# Patient Record
Sex: Male | Born: 1965 | Race: White | Hispanic: No | Marital: Married | Smoking: Current every day smoker
Health system: Southern US, Community
[De-identification: ages and names within clinical notes are randomized; demographics above are authoritative.]

## PROBLEM LIST (undated history)

## (undated) DIAGNOSIS — E785 Hyperlipidemia, unspecified: Secondary | ICD-10-CM

## (undated) DIAGNOSIS — I10 Essential (primary) hypertension: Secondary | ICD-10-CM

## (undated) DIAGNOSIS — G4733 Obstructive sleep apnea (adult) (pediatric): Secondary | ICD-10-CM

## (undated) DIAGNOSIS — H53009 Unspecified amblyopia, unspecified eye: Secondary | ICD-10-CM

## (undated) DIAGNOSIS — N529 Male erectile dysfunction, unspecified: Secondary | ICD-10-CM

## (undated) HISTORY — DX: Essential (primary) hypertension: I10

## (undated) HISTORY — DX: Hyperlipidemia, unspecified: E78.5

## (undated) HISTORY — DX: Male erectile dysfunction, unspecified: N52.9

## (undated) HISTORY — DX: Unspecified amblyopia, unspecified eye: H53.009

## (undated) HISTORY — DX: Obstructive sleep apnea (adult) (pediatric): G47.33

---

## 1998-03-24 ENCOUNTER — Emergency Department (HOSPITAL_COMMUNITY): Admission: AD | Admit: 1998-03-24 | Discharge: 1998-03-24 | Payer: Self-pay | Admitting: Emergency Medicine

## 1999-05-08 ENCOUNTER — Emergency Department (HOSPITAL_COMMUNITY): Admission: EM | Admit: 1999-05-08 | Discharge: 1999-05-08 | Payer: Self-pay | Admitting: Emergency Medicine

## 1999-08-15 ENCOUNTER — Emergency Department (HOSPITAL_COMMUNITY): Admission: EM | Admit: 1999-08-15 | Discharge: 1999-08-15 | Payer: Self-pay | Admitting: Emergency Medicine

## 1999-08-17 ENCOUNTER — Emergency Department (HOSPITAL_COMMUNITY): Admission: EM | Admit: 1999-08-17 | Discharge: 1999-08-17 | Payer: Self-pay | Admitting: Emergency Medicine

## 1999-11-12 ENCOUNTER — Encounter: Payer: Self-pay | Admitting: Internal Medicine

## 1999-11-12 ENCOUNTER — Ambulatory Visit (HOSPITAL_COMMUNITY): Admission: RE | Admit: 1999-11-12 | Discharge: 1999-11-12 | Payer: Self-pay | Admitting: Internal Medicine

## 1999-11-12 ENCOUNTER — Encounter: Admission: RE | Admit: 1999-11-12 | Discharge: 1999-11-12 | Payer: Self-pay | Admitting: Internal Medicine

## 1999-12-03 ENCOUNTER — Encounter: Admission: RE | Admit: 1999-12-03 | Discharge: 1999-12-03 | Payer: Self-pay | Admitting: Internal Medicine

## 2000-01-02 ENCOUNTER — Encounter: Admission: RE | Admit: 2000-01-02 | Discharge: 2000-01-02 | Payer: Self-pay | Admitting: Hematology and Oncology

## 2000-06-19 ENCOUNTER — Emergency Department (HOSPITAL_COMMUNITY): Admission: EM | Admit: 2000-06-19 | Discharge: 2000-06-19 | Payer: Self-pay | Admitting: Emergency Medicine

## 2000-11-11 ENCOUNTER — Encounter: Admission: RE | Admit: 2000-11-11 | Discharge: 2000-11-11 | Payer: Self-pay | Admitting: Internal Medicine

## 2000-12-18 ENCOUNTER — Encounter: Admission: RE | Admit: 2000-12-18 | Discharge: 2000-12-18 | Payer: Self-pay

## 2000-12-26 ENCOUNTER — Encounter: Admission: RE | Admit: 2000-12-26 | Discharge: 2000-12-26 | Payer: Self-pay

## 2001-01-09 ENCOUNTER — Encounter: Admission: RE | Admit: 2001-01-09 | Discharge: 2001-01-09 | Payer: Self-pay | Admitting: Internal Medicine

## 2001-06-10 ENCOUNTER — Encounter: Admission: RE | Admit: 2001-06-10 | Discharge: 2001-06-10 | Payer: Self-pay | Admitting: Internal Medicine

## 2001-11-22 ENCOUNTER — Emergency Department (HOSPITAL_COMMUNITY): Admission: EM | Admit: 2001-11-22 | Discharge: 2001-11-22 | Payer: Self-pay | Admitting: Emergency Medicine

## 2002-03-23 ENCOUNTER — Emergency Department (HOSPITAL_COMMUNITY): Admission: EM | Admit: 2002-03-23 | Discharge: 2002-03-23 | Payer: Self-pay

## 2002-06-24 ENCOUNTER — Encounter: Admission: RE | Admit: 2002-06-24 | Discharge: 2002-06-24 | Payer: Self-pay | Admitting: Internal Medicine

## 2002-12-23 ENCOUNTER — Encounter: Admission: RE | Admit: 2002-12-23 | Discharge: 2002-12-23 | Payer: Self-pay | Admitting: Internal Medicine

## 2002-12-29 ENCOUNTER — Encounter: Admission: RE | Admit: 2002-12-29 | Discharge: 2002-12-29 | Payer: Self-pay | Admitting: Family Medicine

## 2002-12-29 ENCOUNTER — Encounter: Payer: Self-pay | Admitting: Family Medicine

## 2003-01-19 ENCOUNTER — Ambulatory Visit (HOSPITAL_BASED_OUTPATIENT_CLINIC_OR_DEPARTMENT_OTHER): Admission: RE | Admit: 2003-01-19 | Discharge: 2003-01-19 | Payer: Self-pay | Admitting: Internal Medicine

## 2003-03-09 ENCOUNTER — Emergency Department (HOSPITAL_COMMUNITY): Admission: EM | Admit: 2003-03-09 | Discharge: 2003-03-09 | Payer: Self-pay | Admitting: Emergency Medicine

## 2003-03-29 ENCOUNTER — Emergency Department (HOSPITAL_COMMUNITY): Admission: EM | Admit: 2003-03-29 | Discharge: 2003-03-30 | Payer: Self-pay | Admitting: *Deleted

## 2003-05-25 ENCOUNTER — Encounter: Admission: RE | Admit: 2003-05-25 | Discharge: 2003-05-25 | Payer: Self-pay | Admitting: Internal Medicine

## 2003-07-28 ENCOUNTER — Encounter: Admission: RE | Admit: 2003-07-28 | Discharge: 2003-07-28 | Payer: Self-pay | Admitting: Internal Medicine

## 2003-11-25 ENCOUNTER — Emergency Department (HOSPITAL_COMMUNITY): Admission: AD | Admit: 2003-11-25 | Discharge: 2003-11-25 | Payer: Self-pay | Admitting: Family Medicine

## 2003-12-30 ENCOUNTER — Emergency Department (HOSPITAL_COMMUNITY): Admission: EM | Admit: 2003-12-30 | Discharge: 2003-12-30 | Payer: Self-pay | Admitting: Emergency Medicine

## 2004-06-28 ENCOUNTER — Ambulatory Visit: Payer: Self-pay | Admitting: Internal Medicine

## 2004-10-31 ENCOUNTER — Ambulatory Visit: Payer: Self-pay | Admitting: Internal Medicine

## 2005-01-17 ENCOUNTER — Emergency Department (HOSPITAL_COMMUNITY): Admission: EM | Admit: 2005-01-17 | Discharge: 2005-01-17 | Payer: Self-pay | Admitting: Emergency Medicine

## 2005-03-23 ENCOUNTER — Emergency Department (HOSPITAL_COMMUNITY): Admission: EM | Admit: 2005-03-23 | Discharge: 2005-03-23 | Payer: Self-pay | Admitting: Emergency Medicine

## 2005-06-19 ENCOUNTER — Ambulatory Visit: Payer: Self-pay | Admitting: Internal Medicine

## 2005-06-28 ENCOUNTER — Ambulatory Visit: Payer: Self-pay | Admitting: Internal Medicine

## 2005-11-04 ENCOUNTER — Encounter: Admission: RE | Admit: 2005-11-04 | Discharge: 2005-11-04 | Payer: Self-pay | Admitting: Occupational Medicine

## 2006-02-21 ENCOUNTER — Ambulatory Visit: Payer: Self-pay | Admitting: Internal Medicine

## 2006-08-30 ENCOUNTER — Emergency Department: Payer: Self-pay | Admitting: Unknown Physician Specialty

## 2006-10-02 ENCOUNTER — Emergency Department (HOSPITAL_COMMUNITY): Admission: EM | Admit: 2006-10-02 | Discharge: 2006-10-02 | Payer: Self-pay | Admitting: Emergency Medicine

## 2006-10-09 DIAGNOSIS — I10 Essential (primary) hypertension: Secondary | ICD-10-CM | POA: Insufficient documentation

## 2006-10-09 DIAGNOSIS — G4730 Sleep apnea, unspecified: Secondary | ICD-10-CM | POA: Insufficient documentation

## 2006-10-09 DIAGNOSIS — F17200 Nicotine dependence, unspecified, uncomplicated: Secondary | ICD-10-CM | POA: Insufficient documentation

## 2006-10-10 ENCOUNTER — Encounter (INDEPENDENT_AMBULATORY_CARE_PROVIDER_SITE_OTHER): Payer: Self-pay | Admitting: Internal Medicine

## 2006-10-10 ENCOUNTER — Ambulatory Visit: Payer: Self-pay | Admitting: Internal Medicine

## 2006-10-10 DIAGNOSIS — M51379 Other intervertebral disc degeneration, lumbosacral region without mention of lumbar back pain or lower extremity pain: Secondary | ICD-10-CM | POA: Insufficient documentation

## 2006-10-10 DIAGNOSIS — M5137 Other intervertebral disc degeneration, lumbosacral region: Secondary | ICD-10-CM

## 2006-10-10 DIAGNOSIS — E785 Hyperlipidemia, unspecified: Secondary | ICD-10-CM | POA: Insufficient documentation

## 2006-10-10 LAB — CONVERTED CEMR LAB
ALT: 17 units/L (ref 0–53)
AST: 14 units/L (ref 0–37)
CO2: 27 meq/L (ref 19–32)
Cholesterol: 180 mg/dL (ref 0–200)
LDL Cholesterol: 125 mg/dL — ABNORMAL HIGH (ref 0–99)
Sodium: 141 meq/L (ref 135–145)
Total Bilirubin: 0.4 mg/dL (ref 0.3–1.2)
Total CHOL/HDL Ratio: 5.1
Total Protein: 7.3 g/dL (ref 6.0–8.3)
VLDL: 20 mg/dL (ref 0–40)

## 2006-10-28 ENCOUNTER — Encounter: Admission: RE | Admit: 2006-10-28 | Discharge: 2006-10-28 | Payer: Self-pay | Admitting: Occupational Medicine

## 2007-01-22 ENCOUNTER — Encounter (INDEPENDENT_AMBULATORY_CARE_PROVIDER_SITE_OTHER): Payer: Self-pay | Admitting: Internal Medicine

## 2007-01-23 ENCOUNTER — Telehealth (INDEPENDENT_AMBULATORY_CARE_PROVIDER_SITE_OTHER): Payer: Self-pay | Admitting: *Deleted

## 2007-01-26 ENCOUNTER — Ambulatory Visit: Payer: Self-pay | Admitting: Hospitalist

## 2007-01-26 LAB — CONVERTED CEMR LAB
Cholesterol, target level: 200 mg/dL
HDL goal, serum: 40 mg/dL
LDL Goal: 130 mg/dL

## 2007-02-04 ENCOUNTER — Ambulatory Visit: Payer: Self-pay | Admitting: Internal Medicine

## 2007-02-04 ENCOUNTER — Encounter (INDEPENDENT_AMBULATORY_CARE_PROVIDER_SITE_OTHER): Payer: Self-pay | Admitting: Internal Medicine

## 2007-02-05 LAB — CONVERTED CEMR LAB
ALT: 22 units/L (ref 0–53)
AST: 19 units/L (ref 0–37)
Alkaline Phosphatase: 87 units/L (ref 39–117)
BUN: 13 mg/dL (ref 6–23)
Chloride: 105 meq/L (ref 96–112)
Creatinine, Ser: 0.91 mg/dL (ref 0.40–1.50)
HDL: 39 mg/dL — ABNORMAL LOW (ref >39)
LDL Cholesterol: 120 mg/dL — ABNORMAL HIGH (ref 0–99)
Total Bilirubin: 0.5 mg/dL (ref 0.3–1.2)
Total CHOL/HDL Ratio: 4.7
VLDL: 25 mg/dL (ref 0–40)

## 2007-02-25 ENCOUNTER — Ambulatory Visit: Payer: Self-pay | Admitting: Internal Medicine

## 2007-02-25 ENCOUNTER — Encounter (INDEPENDENT_AMBULATORY_CARE_PROVIDER_SITE_OTHER): Payer: Self-pay | Admitting: Dermatology

## 2007-09-20 ENCOUNTER — Emergency Department (HOSPITAL_COMMUNITY): Admission: EM | Admit: 2007-09-20 | Discharge: 2007-09-20 | Payer: Self-pay | Admitting: Family Medicine

## 2007-09-23 ENCOUNTER — Telehealth: Payer: Self-pay | Admitting: Infectious Disease

## 2007-09-25 ENCOUNTER — Ambulatory Visit: Payer: Self-pay | Admitting: Infectious Disease

## 2007-09-25 ENCOUNTER — Encounter (INDEPENDENT_AMBULATORY_CARE_PROVIDER_SITE_OTHER): Payer: Self-pay | Admitting: *Deleted

## 2007-09-25 DIAGNOSIS — F528 Other sexual dysfunction not due to a substance or known physiological condition: Secondary | ICD-10-CM

## 2007-09-25 LAB — CONVERTED CEMR LAB
AST: 12 units/L (ref 0–37)
Albumin: 4.4 g/dL (ref 3.5–5.2)
Alkaline Phosphatase: 85 units/L (ref 39–117)
Glucose, Bld: 96 mg/dL (ref 70–99)
LDL Cholesterol: 122 mg/dL — ABNORMAL HIGH (ref 0–99)
Potassium: 4.3 meq/L (ref 3.5–5.3)
Sodium: 139 meq/L (ref 135–145)
Total Bilirubin: 0.5 mg/dL (ref 0.3–1.2)
Total Protein: 7.2 g/dL (ref 6.0–8.3)
Triglycerides: 106 mg/dL (ref <150)
VLDL: 21 mg/dL (ref 0–40)

## 2007-09-28 ENCOUNTER — Telehealth: Payer: Self-pay | Admitting: Licensed Clinical Social Worker

## 2007-10-07 ENCOUNTER — Telehealth: Payer: Self-pay | Admitting: *Deleted

## 2008-10-31 ENCOUNTER — Telehealth: Payer: Self-pay | Admitting: *Deleted

## 2008-12-13 ENCOUNTER — Ambulatory Visit: Payer: Self-pay | Admitting: Internal Medicine

## 2008-12-13 ENCOUNTER — Encounter (INDEPENDENT_AMBULATORY_CARE_PROVIDER_SITE_OTHER): Payer: Self-pay | Admitting: Internal Medicine

## 2008-12-13 LAB — CONVERTED CEMR LAB
CO2: 24 meq/L (ref 19–32)
Calcium: 9 mg/dL (ref 8.4–10.5)
Chloride: 103 meq/L (ref 96–112)
Creatinine, Ser: 0.89 mg/dL (ref 0.40–1.50)
GFR calc non Af Amer: >60 mL/min (ref >60)
Sodium: 138 meq/L (ref 135–145)

## 2009-01-11 ENCOUNTER — Encounter: Admission: RE | Admit: 2009-01-11 | Discharge: 2009-01-11 | Payer: Self-pay | Admitting: Occupational Medicine

## 2009-07-14 ENCOUNTER — Telehealth (INDEPENDENT_AMBULATORY_CARE_PROVIDER_SITE_OTHER): Payer: Self-pay | Admitting: Internal Medicine

## 2009-11-17 ENCOUNTER — Telehealth (INDEPENDENT_AMBULATORY_CARE_PROVIDER_SITE_OTHER): Payer: Self-pay | Admitting: Internal Medicine

## 2009-12-01 ENCOUNTER — Encounter: Admission: RE | Admit: 2009-12-01 | Discharge: 2009-12-01 | Payer: Self-pay | Admitting: Internal Medicine

## 2009-12-01 ENCOUNTER — Encounter (INDEPENDENT_AMBULATORY_CARE_PROVIDER_SITE_OTHER): Payer: Self-pay | Admitting: Internal Medicine

## 2010-01-01 ENCOUNTER — Ambulatory Visit: Payer: Self-pay | Admitting: Internal Medicine

## 2010-01-26 ENCOUNTER — Telehealth (INDEPENDENT_AMBULATORY_CARE_PROVIDER_SITE_OTHER): Payer: Self-pay | Admitting: *Deleted

## 2010-03-21 ENCOUNTER — Telehealth: Payer: Self-pay | Admitting: Internal Medicine

## 2010-05-25 ENCOUNTER — Emergency Department (HOSPITAL_COMMUNITY): Admission: EM | Admit: 2010-05-25 | Discharge: 2010-05-25 | Payer: Self-pay | Admitting: Family Medicine

## 2010-10-11 NOTE — Letter (Signed)
Summary: RADIOLOGY/EKG/ADIOGRAM/[facility]  RADIOLOGY/EKG/ADIOGRAM/[facility]   Imported By: Margie Billet 01/16/2010 14:39:52  _____________________________________________________________________  External Attachment:    Type:   Image     Comment:   External Document  Appended Document: RADIOLOGY/EKG/ADIOGRAM/[facility]   Impression & Recommendations:  Problem # 1:  DYSLIPIDEMIA (ICD-272.4) See outside record 12/01/2009. LDL at goal, 117.  His updated medication list for this problem includes:    Lovastatin 40 Mg Tabs (Lovastatin) .Marland Kitchen... Take 1 tablet by mouth once a day    Niacin Cr 1000 Mg Tbcr (Niacin) .Marland Kitchen... Take 1 tablet by mouth once a day for cholesterol.   Complete Medication List: 1)  Levitra 10 Mg Tabs (Vardenafil hcl) .... Take one tab by mouth as needed prior to intercourse 2)  Lovastatin 40 Mg Tabs (Lovastatin) .... Take 1 tablet by mouth once a day 3)  Lotensin 10 Mg Tabs (Benazepril hcl) .... Take 1 tablet by mouth once a day 4)  Niacin Cr 1000 Mg Tbcr (Niacin) .... Take 1 tablet by mouth once a day for cholesterol. 5)  Baby Aspirin 81 Mg Chew (Aspirin) .... Take 1 tablet by mouth once a day

## 2010-10-11 NOTE — Progress Notes (Signed)
Summary: med refill/gp  Phone Note Refill Request Message from:  Fax from Pharmacy on Jan 26, 2010 12:23 PM  Refills Requested: Medication #1:  LOTENSIN 10 MG TABS Take 1 tablet by mouth once a day   Last Refilled: 12/22/2009 Last appt. 01/01/10.  Last BMP 12/13/09.   Method Requested: Electronic Initial call taken by: Chinita Pester RN,  Jan 26, 2010 12:23 PM  Follow-up for Phone Call        Rx written Follow-up by: Paulette Blanch Dam MD,  Jan 26, 2010 2:00 PM    Prescriptions: LOTENSIN 10 MG TABS (BENAZEPRIL HCL) Take 1 tablet by mouth once a day  #31 x 11   Entered and Authorized by:   Acey Lav MD   Signed by:   Paulette Blanch Dam MD on 01/26/2010   Method used:   Electronically to        [facility] [phone]* (retail)       [address]       [place], [place]  [address]       Ph: [phone]       Fax: [phone]   RxID:   [id number]

## 2010-10-11 NOTE — Progress Notes (Signed)
Summary: med refill/gp  Phone Note Refill Request Message from:  Fax from Pharmacy on March 21, 2010 12:19 PM  Refills Requested: Medication #1:  LOVASTATIN 40 MG TABS Take 1 tablet by mouth once a day   Dosage confirmed as above?Dosage Confirmed   Last Refilled: 02/05/2010  Method Requested: Electronic Initial call taken by: Chinita Pester RN,  March 21, 2010 12:19 PM    Prescriptions: LOVASTATIN 40 MG TABS (LOVASTATIN) Take 1 tablet by mouth once a day  #31 x 11   Entered and Authorized by:   Jackson Latino MD   Signed by:   Jackson Latino MD on 03/21/2010   Method used:   Electronically to        [facility] [phone]* (retail)       [address]       [place], [place]  [address]       Ph: [phone]       Fax: [phone]   RxID:   [id number]

## 2010-10-11 NOTE — Progress Notes (Signed)
Summary: Refill/gh  Phone Note Refill Request Message from:  Fax from Pharmacy on November 17, 2009 3:47 PM  Refills Requested: Medication #1:  LOTENSIN 10 MG TABS Take 1 tablet by mouth once a day   Last Refilled: 10/15/2009  Method Requested: Electronic Initial call taken by: Angelina Ok RN,  November 17, 2009 3:48 PM    Prescriptions: LOTENSIN 10 MG TABS (BENAZEPRIL HCL) Take 1 tablet by mouth once a day  #31 x 1   Entered and Authorized by:   Zara Council MD   Signed by:   Zara Council MD on 11/17/2009   Method used:   Electronically to        [facility] [phone]* (retail)       [address]       [place], [place]  [address]       Ph: [phone]       Fax: [phone]   RxID:   [id number]

## 2010-10-11 NOTE — Assessment & Plan Note (Signed)
Summary: ROUTINE CK/EST/VS   Vital Signs:  Patient profile:   45 year old male Height:      70 inches (177.80 cm) Weight:      197.01 pounds (89.55 kg) BMI:     28.37 Temp:     98.6 degrees F (37.00 degrees C) Pulse rate:   90 / minute BP sitting:   136 / 82  (left arm)  Vitals Entered By: Angelina Ok RN (January 01, 2010 4:15 PM) Is Patient Diabetic? No Pain Assessment Patient in pain? no      Nutritional Status BMI of 25 - 29 = overweight  Have you ever been in a relationship where you felt threatened, hurt or afraid?No   Does patient need assistance? Functional Status Self care Ambulation Normal Comments Needs refills.  Check up.  Had a physical less than 1 month ago.     Primary Care Provider:  None   History of Present Illness: Isaiah Gomez is a 45 year old Male with PMH/problems as outlined in the EMR, who presents to the [facility] for a routine follow up. He has no complaints today. Doing all his meds as prescribed. He recently had a complete physical and blood work as part of employment physical at [employer].    Preventive Screening-Counseling & Management  Alcohol-Tobacco     Smoking Status: current     Smoking Cessation Counseling: yes     Packs/Day: 1     Year Started: 25 years ago  Current Medications (verified): 1)  Levitra 10 Mg Tabs (Vardenafil Hcl) .... Take One Tab By Mouth As Needed Prior To Intercourse 2)  Lovastatin 40 Mg Tabs (Lovastatin) .... Take 1 Tablet By Mouth Once A Day 3)  Lotensin 10 Mg Tabs (Benazepril Hcl) .... Take 1 Tablet By Mouth Once A Day 4)  Niacin Cr 1000 Mg  Tbcr (Niacin) .... Take 1 Tablet By Mouth Once A Day For Cholesterol. 5)  Baby Aspirin 81 Mg  Chew (Aspirin) .... Take 1 Tablet By Mouth Once A Day  Allergies (verified): 1)  ! Prednisone 2)  ! 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  Married.  Smokes 1ppd x 20 years.  Occasional beer.  No drugs.  Risk Factors: Caffeine Use: 5+ (12/13/2008) Exercise: no (12/13/2008)  Risk Factors: Smoking Status: current (01/01/2010) Packs/Day: 1 (01/01/2010)  Review of Systems       Review of System: Negative except per HPI.    Physical Exam  General:  alert and well-developed.   Lungs:  normal respiratory effort and normal breath sounds.   Heart:  normal rate and regular rhythm.   Abdomen:  soft and non-tender.   Pulses:  normal peripheral pulses  Extremities:  no cyanosis, clubbing or edema  Neurologic:  alert & oriented X3.  non focal   Impression & Recommendations:  Problem # 1:  DYSLIPIDEMIA (ICD-272.4) Continue the current regimen. I wil review his lab work and see if any changes needed.  His updated medication list for this problem includes:    Lovastatin 40 Mg Tabs (Lovastatin) .Marland Kitchen... Take 1 tablet by mouth once a day  Niacin Cr 1000 Mg Tbcr (Niacin) .Marland Kitchen... Take 1 tablet by mouth once a day for cholesterol.  Problem # 2:  HYPERTENSION (ICD-401.9) At goal. Continue the current regimen.  Will review b-met.  His updated medication list for this problem includes:    Lotensin 10 Mg Tabs (Benazepril hcl) .Marland Kitchen... Take 1 tablet by mouth once a day  Complete Medication List: 1)  Levitra 10 Mg Tabs (Vardenafil hcl) .... Take one tab by mouth as needed prior to intercourse 2)  Lovastatin 40 Mg Tabs (Lovastatin) .... Take 1 tablet by mouth once a day 3)  Lotensin 10 Mg Tabs (Benazepril hcl) .... Take 1 tablet by mouth once a day 4)  Niacin Cr 1000 Mg Tbcr (Niacin) .... Take 1 tablet by mouth once a day for  cholesterol. 5)  Baby Aspirin 81 Mg Chew (Aspirin) .... Take 1 tablet by mouth once a day  Patient Instructions: 1)  Please schedule a follow-up appointment in 3-6  months. 2)  Please bring in your lab results so that we can review.   Prevention & Chronic Care Immunizations   Influenza vaccine: Not documented   Influenza vaccine deferral: Deferred  (01/01/2010)    Tetanus booster: Not documented    Pneumococcal vaccine: Not documented  Other Screening   Smoking status: current  (01/01/2010)   Smoking cessation counseling: yes  (01/01/2010)  Lipids   Total Cholesterol: 191  (09/25/2007)   LDL: 122  (09/25/2007)   LDL Direct: Not documented   HDL: 48  (09/25/2007)   Triglycerides: 106  (09/25/2007)    SGOT (AST): 12  (09/25/2007)   SGPT (ALT): 19  (09/25/2007)   Alkaline phosphatase: 85  (09/25/2007)   Total bilirubin: 0.5  (09/25/2007)    Lipid flowsheet reviewed?: Yes   Progress toward LDL goal: Unchanged  Hypertension   Last Blood Pressure: 136 / 82  (01/01/2010)   Serum creatinine: 0.89  (12/13/2008)   Serum potassium 4.0  (12/13/2008)    Hypertension flowsheet reviewed?: Yes   Progress toward BP goal: At goal  Self-Management Support :    Patient will work on the following items until the next clinic visit to reach self-care goals:     Medications and monitoring: take my medicines every day, bring all of my medications to every visit  (01/01/2010)     Eating: drink diet soda or water instead of juice or soda, use fresh or frozen vegetables, eat foods that are low in salt, eat baked foods instead of fried foods, limit or avoid alcohol  (01/01/2010)     Activity: take a 30 minute walk every day, take the stairs instead of the elevator  (01/01/2010)    Hypertension self-management support: Written self-care plan, Education handout, Pre-printed educational material, Resources for patients handout  (01/01/2010)   Hypertension self-care plan printed.   Hypertension  education handout printed    Lipid self-management support: Written self-care plan, Education handout, Pre-printed educational material, Resources for patients handout  (01/01/2010)   Lipid self-care plan printed.   Lipid education handout printed      Resource handout printed.     Vital Signs:  Patient profile:   45 year old male Height:      70 inches (177.80 cm) Weight:      197.01 pounds (89.55 kg) BMI:     28.37 Temp:     98.6 degrees F (37.00 degrees C) Pulse rate:   90 / minute BP sitting:   136 / 82  (left arm)  Vitals Entered By: Angelina Ok RN (January 01, 2010 4:15 PM)

## 2011-01-22 ENCOUNTER — Encounter: Payer: Self-pay | Admitting: Internal Medicine

## 2011-02-06 ENCOUNTER — Encounter: Payer: Self-pay | Admitting: Internal Medicine

## 2011-02-06 ENCOUNTER — Ambulatory Visit (INDEPENDENT_AMBULATORY_CARE_PROVIDER_SITE_OTHER): Payer: BC Managed Care – PPO | Admitting: Internal Medicine

## 2011-02-06 DIAGNOSIS — Z23 Encounter for immunization: Secondary | ICD-10-CM

## 2011-02-06 DIAGNOSIS — E785 Hyperlipidemia, unspecified: Secondary | ICD-10-CM

## 2011-02-06 DIAGNOSIS — F17200 Nicotine dependence, unspecified, uncomplicated: Secondary | ICD-10-CM

## 2011-02-06 DIAGNOSIS — I10 Essential (primary) hypertension: Secondary | ICD-10-CM

## 2011-02-06 DIAGNOSIS — M7710 Lateral epicondylitis, unspecified elbow: Secondary | ICD-10-CM | POA: Insufficient documentation

## 2011-02-06 DIAGNOSIS — R6882 Decreased libido: Secondary | ICD-10-CM

## 2011-02-06 LAB — BASIC METABOLIC PANEL
CO2: 27 mEq/L (ref 19–32)
Calcium: 9.5 mg/dL (ref 8.4–10.5)
Creat: 0.89 mg/dL (ref 0.40–1.50)
Glucose, Bld: 78 mg/dL (ref 70–99)

## 2011-02-06 LAB — TSH: TSH: 4.784 u[IU]/mL — ABNORMAL HIGH (ref 0.350–4.500)

## 2011-02-06 LAB — CBC
MCHC: 34.1 g/dL (ref 30.0–36.0)
Platelets: 165 10*3/uL (ref 150–400)
RDW: 12.1 % (ref 11.5–15.5)
WBC: 8 10*3/uL (ref 4.0–10.5)

## 2011-02-06 MED ORDER — PRAVASTATIN SODIUM 40 MG PO TABS: 40.0000 mg | ORAL_TABLET | Freq: Every evening | ORAL | Status: DC

## 2011-02-06 MED ORDER — DICLOFENAC SODIUM 75 MG PO TBEC: 75.0000 mg | DELAYED_RELEASE_TABLET | Freq: Two times a day (BID) | ORAL | Status: DC

## 2011-02-06 NOTE — Assessment & Plan Note (Signed)
Patient was counseled on smoking cessation strategies including medications and behavior modification options. Patient said she was not ready to stop smoking at this time.    

## 2011-02-06 NOTE — Patient Instructions (Signed)
Tennis Elbow Your caregiver has diagnosed you with a condition often referred to as "Tennis Elbow." This results from small tears or soreness (inflammation) at the start (origin) of the extensor muscles of the forearm. Although the condition is often called tennis or golfer's elbow, it is caused by any repetitive action performed by your elbow. HOME CARE INSTRUCTIONS  If the condition has been short lived, rest may be the only treatment required. Using your opposite hand or arm to perform the task may help. Even changing your grip may help rest the extremity. These may even prevent the condition from recurring.   Longer standing problems, however, will often be relieved faster by:   Using anti-inflammatory agents.   Applying ice packs for 30 minutes at the end of the working day, at bed time, or when activities are finished.   Your caregiver may also have you wear a splint or sling. This will allow the inflamed tendon to heal.  At times, steroid injections aided with a local anesthetic will be required along with splinting for 1 to 2 weeks. Two to three steroid injections will often solve the problem. In some long standing cases, the inflamed tendon does not respond to conservative (non-surgical) therapy. Then surgery may be required to repair it. MAKE SURE YOU:   Understand these instructions.   Will watch your condition.   Will get help right away if you are not doing well or get worse.  Document Released: 08/26/2005 Document Re-Released: 11/22/2008 Exit[employer] Patient Information 2011 [employer]. 

## 2011-02-06 NOTE — Progress Notes (Signed)
  Subjective:    Patient ID: Isaiah Gomez, male    DOB: [birth year], 45 y.o.   MRN: [id number]  HPI  Patient is a 45 year old male with a past medical history of hypertension, hyperlipidemia presents to the outpatient clinic for chief complaint of right lateral elbow pain, the patient describes the pain as dull in nature and has been constant for the past month, the pain is aggravated by movement especially pronation and relieved by rest. The patient reports a tender point in his lateral epicondyle region. No swelling has been noted. On review of patient's chart his cholesterol is above goal despite being on lovastatin. Patient has been compliant with all his medications denies any other complaints.  Review of Systems  [all other systems reviewed and are negative       Objective:   Physical Exam  Constitutional: He is oriented to person, place, and time. He appears well-developed and well-nourished.  HENT:  Head: Normocephalic and atraumatic.  Eyes: Pupils are equal, round, and reactive to light.  Neck: Normal range of motion. No JVD present. No thyromegaly present.  Cardiovascular: Normal rate, regular rhythm and normal heart sounds.   Pulmonary/Chest: Effort normal and breath sounds normal. He has no wheezes. He has no rales.  Abdominal: Soft. Bowel sounds are normal. There is no tenderness. There is no rebound.  Musculoskeletal: Normal range of motion. He exhibits no edema.       Right elbow: tenderness found.       Arms: Neurological: He is alert and oriented to person, place, and time.  Skin: Skin is warm and dry.          Assessment & Plan:

## 2011-02-06 NOTE — Assessment & Plan Note (Signed)
Patient's LDL is elevated despite being on lovastatin, will change to pravastatin and bring back in one month to recheck fasting lipids along with liver function tests.

## 2011-02-06 NOTE — Assessment & Plan Note (Signed)
Well controlled on current treatment, No new changes made today, Will continue to monitor.   

## 2011-02-06 NOTE — Assessment & Plan Note (Addendum)
Patient reports decreased libido and decreased energy. He feels that he may have low testosterone. We'll check testosterone along with TSH and CBC, patient denies any depressive symptoms to explain his decreased libido. If an underlying cause is found for his decreased libido we'll proceed with appropriate treatment

## 2011-02-06 NOTE — Assessment & Plan Note (Signed)
Patient's presentation is consistent with tennis elbow given physical exam finding history of repetitive motion and reproducibility of the pain on examination. I discussed this with the patient in length and provide him with reading material on basic exercises he can do to relieve the pain. Also provide the patient with short-term supply of nonsteroidal anti-inflammatory drugs, if this does not relieve his symptoms patient may benefit from a corticosteroid injection plus minus the need for physical therapy, if patient fails conservative therapy will refer to sports medicine.

## 2011-02-07 LAB — TESTOSTERONE, FREE, TOTAL, SHBG
Sex Hormone Binding: 14 nmol/L (ref 13–71)
Testosterone-% Free: 2.8 % (ref 1.6–2.9)
Testosterone: 183.31 ng/dL — ABNORMAL LOW (ref 250–890)

## 2011-02-18 ENCOUNTER — Telehealth: Payer: Self-pay | Admitting: Internal Medicine

## 2011-02-18 NOTE — Telephone Encounter (Signed)
Attempted to contact patient again, no response. I left a message on his voice mail instructing the patient to call the clinic and make a followup so we can perform further tests to followup on his most recent lab results.   He will need another followup to discuss his low testosterone and low energy. We may start testosterone gel daily. and check free T4, given his elevated TSH.

## 2011-02-25 ENCOUNTER — Other Ambulatory Visit: Payer: Self-pay | Admitting: *Deleted

## 2011-02-25 MED ORDER — BENAZEPRIL HCL 10 MG PO TABS: 10.0000 mg | ORAL_TABLET | Freq: Every day | ORAL | Status: DC

## 2011-03-07 ENCOUNTER — Encounter: Payer: Self-pay | Admitting: Internal Medicine

## 2011-03-07 ENCOUNTER — Ambulatory Visit (INDEPENDENT_AMBULATORY_CARE_PROVIDER_SITE_OTHER): Payer: BC Managed Care – PPO | Admitting: Internal Medicine

## 2011-03-07 DIAGNOSIS — F528 Other sexual dysfunction not due to a substance or known physiological condition: Secondary | ICD-10-CM

## 2011-03-07 DIAGNOSIS — M7710 Lateral epicondylitis, unspecified elbow: Secondary | ICD-10-CM

## 2011-03-07 DIAGNOSIS — E785 Hyperlipidemia, unspecified: Secondary | ICD-10-CM

## 2011-03-07 DIAGNOSIS — J069 Acute upper respiratory infection, unspecified: Secondary | ICD-10-CM

## 2011-03-07 MED ORDER — GUAIFENESIN 250 MG/5ML PO LIQD: 5.0000 mL | Freq: Four times a day (QID) | ORAL | Status: DC | PRN

## 2011-03-11 ENCOUNTER — Other Ambulatory Visit: Payer: BC Managed Care – PPO

## 2011-03-11 DIAGNOSIS — F528 Other sexual dysfunction not due to a substance or known physiological condition: Secondary | ICD-10-CM

## 2011-03-11 DIAGNOSIS — E785 Hyperlipidemia, unspecified: Secondary | ICD-10-CM

## 2011-03-11 LAB — LIPID PANEL
Cholesterol: 142 mg/dL (ref 0–200)
HDL: 34 mg/dL — ABNORMAL LOW (ref >39)
LDL Cholesterol: 93 mg/dL (ref 0–99)
Total CHOL/HDL Ratio: 4.2 Ratio
Triglycerides: 73 mg/dL (ref <150)
VLDL: 15 mg/dL (ref 0–40)

## 2011-03-11 LAB — HEPATIC FUNCTION PANEL
Albumin: 4.2 g/dL (ref 3.5–5.2)
Bilirubin, Direct: 0.1 mg/dL (ref 0.0–0.3)
Total Bilirubin: 0.3 mg/dL (ref 0.3–1.2)

## 2011-03-14 NOTE — Assessment & Plan Note (Signed)
Patient continues to have pain and swelling but improvement is notable with conservative therapy. Patient would like to hold of on corticosteroid injection and referral to sports medicine.

## 2011-03-14 NOTE — Assessment & Plan Note (Signed)
Likely viral URI. Resolving. Advised conservative measurement. Informed the patient to call the clinic if he is experiencing fevers and chill and the cough is worsening and productive.

## 2011-03-14 NOTE — Progress Notes (Signed)
  Subjective:    Patient ID: Isaiah Gomez, male    DOB: May 28, 1966, 45 y.o.   MRN: [id number]  HPI  This is a 45 year old male with PMH significant for HLD, degenerative disc disease who presented to the clinic with upper resp. Infection. 1. Patient noted since 1 week he was experiencing runny nose, cough ( occasionally productive, yellowish in color mostly in the morning) and weakness but denies any fevers or chill. Denies any recent sick contact, travel nausea, vomiting, Patient noted that he is actually feeling better today. 2. Elbow: the pain in the elbow seems to be improving. He has been prescribed NSAIDs and was advised to do exercise. Patient noted that he has been a bandage on a regular basis 3. Decreased Libido: TSH was slightly elevated, Testosterone level was low, but free testosterone and sex binding hormone was normal. Patient continues to have decreased libido    Review of Systems  Constitutional: Positive for fatigue. Negative for chills.  HENT: Positive for congestion, rhinorrhea, sneezing and postnasal drip. Negative for facial swelling.   Eyes: Negative for visual disturbance.  Respiratory: Positive for cough. Negative for shortness of breath and wheezing.   Cardiovascular: Negative for chest pain, palpitations and leg swelling.  Gastrointestinal: Negative for abdominal distention.  Genitourinary: Negative for discharge, penile swelling, scrotal swelling, difficulty urinating, penile pain and testicular pain.  Musculoskeletal: Negative for back pain and arthralgias.  Neurological: Negative for weakness.       Objective:   Physical Exam  Constitutional: He is oriented to person, place, and time. He appears well-developed.  HENT:  Head: Normocephalic.  Right Ear: No drainage or tenderness.  Left Ear: No drainage or tenderness.  Nose: Right sinus exhibits maxillary sinus tenderness. Left sinus exhibits maxillary sinus tenderness.  Eyes: Pupils are equal, round, and  reactive to light.  Cardiovascular: Normal rate, regular rhythm and normal heart sounds.   Pulmonary/Chest: Effort normal and breath sounds normal.  Abdominal: Soft. Bowel sounds are normal.  Musculoskeletal: Normal range of motion.  Neurological: He is alert and oriented to person, place, and time. No cranial nerve deficit.  Psychiatric: He has a normal mood and affect.          Assessment & Plan:

## 2011-03-14 NOTE — Assessment & Plan Note (Addendum)
Testosterone was decreased on lab work. Patient continues to have decreased libido and fatigue. Will recheck Testosterone level in the morning and check freeT4. If this continues to be low will consider to refer the patient to Endocrinology for further evaluation. Patient may require  for [facility] and LH for further evaluation.   Repeat Testosterone level is low again. Will check FH,LH and refer the patient to Endocrinology for further evaluation and management.

## 2011-03-18 NOTE — Progress Notes (Deleted)
  Subjective:    Patient ID: Isaiah Gomez, male    DOB: 07/05/1966, 44 y.o.   MRN: [id number]  HPI    Review of Systems     Objective:   Physical Exam        Assessment & Plan:   

## 2011-03-18 NOTE — Progress Notes (Signed)
Addended by: Almyra Deforest on: 03/18/2011 03:30 PM   Modules accepted: Orders

## 2011-03-18 NOTE — Progress Notes (Deleted)
  Subjective:    Patient ID: Isaiah Gomez, male    DOB: 01/21/1966, 44 y.o.   MRN: [id number]  HPI    Review of Systems     Objective:   Physical Exam        Assessment & Plan:   

## 2011-03-18 NOTE — Progress Notes (Deleted)
  Subjective:    Patient ID: Isaiah Gomez, male    DOB: Nov 27, 1965, 45 y.o.   MRN: [id number]  HPI    Review of Systems     Objective:   Physical Exam        Assessment & Plan:

## 2011-03-19 NOTE — Progress Notes (Signed)
Addended by: Remus Blake on: 03/19/2011 10:16 AM   Modules accepted: Orders

## 2011-03-19 NOTE — Progress Notes (Signed)
Addended by: Remus Blake on: 03/19/2011 10:53 AM   Modules accepted: Orders

## 2011-04-01 ENCOUNTER — Other Ambulatory Visit: Payer: BC Managed Care – PPO

## 2011-04-01 DIAGNOSIS — F528 Other sexual dysfunction not due to a substance or known physiological condition: Secondary | ICD-10-CM

## 2011-04-01 LAB — LUTEINIZING HORMONE: LH: 9 m[IU]/mL (ref 1.5–9.3)

## 2011-04-10 ENCOUNTER — Encounter: Payer: Self-pay | Admitting: Internal Medicine

## 2011-04-10 ENCOUNTER — Ambulatory Visit (INDEPENDENT_AMBULATORY_CARE_PROVIDER_SITE_OTHER): Payer: BC Managed Care – PPO | Admitting: Internal Medicine

## 2011-04-10 VITALS — BP 117/70 | HR 83 | Temp 97.3°F | Ht 69.0 in | Wt 193.4 lb

## 2011-04-10 DIAGNOSIS — I10 Essential (primary) hypertension: Secondary | ICD-10-CM

## 2011-04-10 DIAGNOSIS — F524 Premature ejaculation: Secondary | ICD-10-CM

## 2011-04-10 DIAGNOSIS — F17200 Nicotine dependence, unspecified, uncomplicated: Secondary | ICD-10-CM

## 2011-04-10 DIAGNOSIS — F528 Other sexual dysfunction not due to a substance or known physiological condition: Secondary | ICD-10-CM

## 2011-04-10 DIAGNOSIS — M7710 Lateral epicondylitis, unspecified elbow: Secondary | ICD-10-CM

## 2011-04-10 DIAGNOSIS — E785 Hyperlipidemia, unspecified: Secondary | ICD-10-CM

## 2011-04-10 MED ORDER — PAROXETINE HCL 20 MG PO TABS: 20.0000 mg | ORAL_TABLET | Freq: Every day | ORAL | Status: DC

## 2011-04-10 NOTE — Assessment & Plan Note (Signed)
Patient's elbow pain seems to be improving overall, though pain is still intermittent.  At this time, he is not interested in being referred to sports medicine or having a corticosteroid injection, as he feels this will only cover up the pain, and for now, the pain serves a function of letting him know when to stop working it so much.    Plan: Continue using a brace, but not while sleeping.  I suggested that he use OTC NSAIDs to decrease inflammation as needed.  If pain worsens, we may consider radiographic imaging at that time.  There is no indication for an XR currently.

## 2011-04-10 NOTE — Assessment & Plan Note (Signed)
Patient's HTN is well managed.  No active issues at this time.  Plan: Continue Lotensin

## 2011-04-10 NOTE — Progress Notes (Signed)
  Subjective:    Patient ID: Isaiah Gomez, male    DOB: [birth year], 45 y.o.   MRN: [id number]  HPI Patient is a 45 yo M with PMH significant for dyslipidemia, tobacco abuse, HTN and tennis elbow who presents for follow up after having labs drawn for premature ejaculation.  Previously, this was labeled as erectile dysfunction, but upon questioning patient, he reports that he is able to achieve an erection, but he ejaculates prematurely, and this problem is progressing.  He reports that it started about 6 months ago, but denies an inciting event and denies any stressors on his relationship (patient is married).  He reports that a few years ago, he did have erectile dysfunction 2/2 HCTZ use, but his then doctor switched him to Lotensin and prescribed Levitra.  He has not used levitra in 2-3 years.  He has had no problems with erection since that time.    He also complains of right lateral elbow pain which he describes as sore, and he has known tennis elbow.  He reports that the pain is improved, though intermittent.  He was given a course of diclofenac, but reports no real relief.  He notes that he still feels a knot on his lateral elbow, and it occasionally wakes him from sleep.  His elbow has been bothering him for the last few months, but he denies an inciting incident such as trauma.  He notes that the pain worsens with movement (pronation/supination), but he denies pain with wrist flexion/extension.  He denies any numbness/tingling of his right arm to hand.  Finally, his URI for which he was here in June has resolved.    He continues to smoke 1 PPD.  He is not interested in quitting at this time.   Review of Systems ROS: General: no fevers, chills, changes in weight, changes in appetite Skin: no rash HEENT: no blurry vision, hearing changes, sore throat Pulm: no dyspnea, coughing, wheezing CV: no chest pain, palpitations, shortness of breath Abd: no abdominal pain, nausea/vomiting,  diarrhea/constipation GU: no dysuria, hematuria, polyuria Neuro: no weakness, numbness, or tingling     Objective:   Physical Exam  General: resting in bed HEENT: PERRL, EOMI, no scleral icterus Cardiac: RRR, no rubs, murmurs or gallops Pulm: clear to auscultation bilaterally, moving normal volumes of air Abd: soft, nontender, nondistended, normoactive BS Ext: warm and well perfused, no pedal edema; no erythema over upper right extremity epicondyle, full range of motion of all extremities Neuro: alert and oriented X3, cranial nerves II-XII grossly intact, strength and sensation to light touch equal in bilateral upper and lower extremities (no strength deficits of right upper extremity)  Labs:  Testosterone: 194  (prior, 183)  (L) FSH: 6.7 (N) LH: 9 (N) TSH: 4.784 (H) Free T4: 1.19 (N)  Assessment & Plan:

## 2011-04-10 NOTE — Assessment & Plan Note (Signed)
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  He doesn't seem to have a psychosocial issue that may be causing this, but I will continue to explore this possibility as I build my relationship with him, though he seems comfortable discussing this problem.  At this time, because low testosterone levels have not been correlated with premature ejaculation, we will defer from referring to endocrinology given his current complaints.  We will also not supplement testosterone at this time given risks with treatment.    Plan: Start Paroxetine 20mg  PO daily to delay ejactulation.  He was instructed to call the clinic should he experience side effects such as mood disturbances.  He will follow up in 5-6 weeks.

## 2011-04-10 NOTE — Assessment & Plan Note (Signed)
Patient reports that he continues to smoke 1 PPD x 30 years.  He fully understands the risks of smoking, and how smoking is related specifically to his medical problems of HTN, HLD, and sexual dysfunction.  We continued to encourage his smoking cessation, but at this time, he is not interested in quitting as smoking serves as a stress reliever.  I encouraged him to call the clinic should he decide to quit, and we will be ready to prescribe a nicotine patch as well as enroll him in a smoking cessation program.

## 2011-04-10 NOTE — Progress Notes (Signed)
Addended by: Blanch Media A on: 04/10/2011 05:27 PM   Modules accepted: Orders

## 2011-04-10 NOTE — Assessment & Plan Note (Signed)
Patient had a fasting lipid panal drawn in July, 2012.  His lipids were WNL with the exception of low HDL.  Patient was encouraged to exercise, and he reports compliance with antihyperlipidemic medications.  Plan: Continue Niacin and Pravastatin

## 2011-04-10 NOTE — Patient Instructions (Signed)
-  Please take Paroxetine 20mg  by mouth daily. This should help with premature ejaculation.  If you have any side effects such as mood disturbances, please call the clinic at [phone].    -Please follow up at the internal medicine out patient clinic in 5-6 weeks   -If your right elbow pain significantly worsens, please return to the clinic.  You may try Ibuprofen for pain relief and to reduce inflammation.    -If you are interested in smoking cessation, please contact the clinic at [phone].  Thank you!

## 2011-04-11 NOTE — Progress Notes (Signed)
I saw and discussed Isaiah Gomez with Dr Milbert Coulter and I agree with her note. Pt is in agreement with Dr Tammy Sours plan.

## 2011-05-16 ENCOUNTER — Encounter: Payer: BC Managed Care – PPO | Admitting: Internal Medicine

## 2011-05-21 ENCOUNTER — Inpatient Hospital Stay (INDEPENDENT_AMBULATORY_CARE_PROVIDER_SITE_OTHER)
Admission: RE | Admit: 2011-05-21 | Discharge: 2011-05-21 | Disposition: A | Discharge status: Home / Self Care | Payer: BC Managed Care – PPO | Source: Ambulatory Visit | Attending: Family Medicine | Admitting: Family Medicine

## 2011-05-21 DIAGNOSIS — J029 Acute pharyngitis, unspecified: Secondary | ICD-10-CM

## 2011-05-21 LAB — POCT RAPID STREP A: Streptococcus, Group A Screen (Direct): NEGATIVE

## 2011-05-23 ENCOUNTER — Encounter: Payer: Self-pay | Admitting: Internal Medicine

## 2011-05-23 ENCOUNTER — Ambulatory Visit (INDEPENDENT_AMBULATORY_CARE_PROVIDER_SITE_OTHER): Payer: BC Managed Care – PPO | Admitting: Internal Medicine

## 2011-05-23 DIAGNOSIS — F17200 Nicotine dependence, unspecified, uncomplicated: Secondary | ICD-10-CM

## 2011-05-23 DIAGNOSIS — F528 Other sexual dysfunction not due to a substance or known physiological condition: Secondary | ICD-10-CM

## 2011-05-23 NOTE — Progress Notes (Signed)
Subjective:   Patient ID: Isaiah Gomez male   DOB: [birth year] 45 y.o.   MRN: [id number]  HPI: [name] is a 45 y.o. who presents today for follow up after starting paroxetine for premature ejaculation 5 weeks ago.  He feels that premature ejaculation has improved and paroxetine is helping with mood as well (notes that he used to feel more snappy).  No adverse mood changes or suicidal ideation.  Takes medication in AM with blood pressure medicine.   Of note, patient presented to urgent care on 05/21/11 with complaint of sore throat about 1 week prior to presentation that had been progressively worsening. He notes that he was still smoking with sore throat.  He slept the majority of Friday (05/17/11) night to Sunday AM covered with a quilt (normally uses a sheet), experienced tactile fever & sweating, and felt that his fever broke Sunday AM. He denies cough or sputum production.  He began to have nasal congestion around last Monday (05/20/11) or Tuesday. He endorses headache Friday (05/17/11) & Saturday evening that improved with tylenol.  He went to urgent care on Tuesday 05/21/11 because throat wasn't improving. He reports that his wife looked in his throat and thought she saw blisters & white ring in back of throat.  He skipped work Monday and Tuesday. Overall now feels like getting better with amoxicillin (10 day course) that was started on 05/21/11.  No new SOB/CP.    Past Medical History  Diagnosis Date  . Dyslipidemia   . Hypertension   . Sleep apnoea, obstructed     mild  . Erectile dysfunction    Current Outpatient Prescriptions  Medication Sig Dispense Refill  . amoxicillin-clavulanate (AUGMENTIN) 875-125 MG per tablet       . aspirin 81 MG tablet Take 81 mg by mouth daily.        . benazepril (LOTENSIN) 10 MG tablet Take 1 tablet (10 mg total) by mouth daily.  31 tablet  2  . Niacin CR 1000 MG TBCR Take 1 tablet by mouth daily.        Marland Kitchen PARoxetine (PAXIL) 20 MG tablet Take 1  tablet (20 mg total) by mouth daily.  30 tablet  2  . pravastatin (PRAVACHOL) 40 MG tablet Take 1 tablet (40 mg total) by mouth every evening.  30 tablet  11  . diclofenac (VOLTAREN) 75 MG EC tablet Take 1 tablet (75 mg total) by mouth 2 (two) times daily with a meal.  60 tablet  0   Family History  Problem Relation Age of Onset  . Diabetes Mother   . Hypertension Mother   . Cancer Mother     colon cancer dx 3 years ago  . Coronary artery disease Father   . Hypertension Father   . Valvular heart disease Father     h/o tricuspid valve replacement surgery  . Coronary artery disease Sister     age of onset <60 in 74st degree male relatives   Social History  . Marital Status: Married   Social History Main Topics  . Smoking status: Current Everyday Smoker -- 1.0 packs/day for 20 years    Types: Cigarettes  . Smokeless tobacco: None  . Alcohol Use: Yes     occasional beer  . Drug Use: No  . 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  Patient is a well-developed and well-nourished male in no acute distress and cooperative with exam. Alert and oriented x3.  Ear: TM normal bilaterally, no erythema or exudate visible in ear canal Mouth: MMM, positive for white/foamy appearing exudate oropharynx, minimal erythema Eyes: PERRL, EOMI,  conjunctivae normal, No scleral icterus.  Neck: Supple, Trachea midline normal ROM  Cardiovascular: RRR, S1 normal, S2 normal, no MRG, pulses symmetric and intact bilaterally Pulmonary/Chest: CTAB, no wheezes, rales, or rhonchi Abdominal: Soft. Non-tender, non-distended, bowel sounds are normal, no masses, organomegaly, or guarding present.  GU: no CVA tenderness Musculoskeletal: No joint deformities, erythema, or stiffness, ROM full and non nontender Hematology: no inginal or axillary adenopathy, mild cervical lymphadenopathy (nontender,mobile)  Neurological: A&O x3, Strength is normal and symmetric bilaterally, cranial nerve II-XII are grossly intact, no focal motor deficit, sensory intact to light touch bilaterally.  Skin: Warm, dry and intact. No rash, cyanosis, or clubbing.  Psychiatric: Normal mood and affect. speech and behavior is normal. Judgment and thought content normal. Cognition and memory are normal.   Assessment & Plan:  Case and care discussed with Dr. Aundria Rud. Please see problem oriented charting for more details.  Patient to return in 1 year for routine physical/follow up.  He denied flu shot at this visit because of recent URI, so I suggested that he call the clinic for a nurses visit when he feels better.

## 2011-05-23 NOTE — Progress Notes (Deleted)
  Subjective:    Patient ID: Isaiah Gomez, male    DOB: April 25, 1966, 45 y.o.   MRN: [id number]  HPI  Sore throat about 1 week ago, progressively worsened, still smoking wwith sore throat, slept majority of Friday night to Sunday AM covered with quilt (normally uses a sheet), tactile fever, sweating, felt like fever broke Sunday AM, no cough or sputum production.  Began to have nasal congestion around last Monday or Tuesday. Did have headache Friday & sat evening that improved with tylenol.  Went to urgent care on Tuesday 9/11 because throat wasn't improving; wife looked in throat and thought saw blisters & white ring in back of throat, skipped work Monday and Tuesday. Overall now feels like getting better with amoxicillin (10 day course)  Feels like premature ejaculation has improved and paroxetine is helping with mood as well (notes that he used to feel more snappy).  No adverse mood changes or suicidal ideation.  Takes medication in AM with blood pressure medicine.   No new SOB/CP.    Review of Systems     Objective:   Physical Exam        Assessment & Plan:

## 2011-05-23 NOTE — Assessment & Plan Note (Signed)
We re-addressed smoking cessation again at this visit.  At this point, patient is still not ready to quit.  We discussed health & financial benefits of quitting, but he notes that he still enjoys it too much, but he will continue thinking about cessation. I advised him to call us at the clinic when he is ready to quit and we can connect him to our Child psychotherapist for smoking cessation.

## 2011-05-23 NOTE — Assessment & Plan Note (Signed)
Patient reports that problem has improved after starting paroxetine. No significant adverse effects since starting paroxetine.  Plan: Continue Paroxetine 20mg  daily (he takes this medication in the AM)

## 2011-05-23 NOTE — Patient Instructions (Addendum)
-  Please be sure to complete your course of antibiotics, even if you start to feel better!  -If you feel like your symptoms are worsening, please call the clinic at [phone].  -Please follow up in 1 year for your annual check-up, unless you develop new symptoms.

## 2011-05-24 NOTE — Progress Notes (Signed)
agree with plans and notes 

## 2011-06-10 ENCOUNTER — Other Ambulatory Visit: Payer: Self-pay | Admitting: *Deleted

## 2011-06-10 MED ORDER — BENAZEPRIL HCL 10 MG PO TABS: 10.0000 mg | ORAL_TABLET | Freq: Every day | ORAL | Status: DC

## 2011-07-11 HISTORY — PX: MULTIPLE TOOTH EXTRACTIONS: SHX2053

## 2011-07-23 ENCOUNTER — Other Ambulatory Visit: Payer: Self-pay | Admitting: Internal Medicine

## 2011-10-18 ENCOUNTER — Encounter: Payer: Self-pay | Admitting: Internal Medicine

## 2011-10-18 ENCOUNTER — Ambulatory Visit (INDEPENDENT_AMBULATORY_CARE_PROVIDER_SITE_OTHER): Payer: BC Managed Care – PPO | Admitting: Internal Medicine

## 2011-10-18 VITALS — BP 123/70 | HR 71 | Temp 97.6°F | Ht 69.0 in | Wt 206.5 lb

## 2011-10-18 DIAGNOSIS — I10 Essential (primary) hypertension: Secondary | ICD-10-CM

## 2011-10-18 DIAGNOSIS — R519 Headache, unspecified: Secondary | ICD-10-CM | POA: Insufficient documentation

## 2011-10-18 DIAGNOSIS — E785 Hyperlipidemia, unspecified: Secondary | ICD-10-CM

## 2011-10-18 DIAGNOSIS — R51 Headache: Secondary | ICD-10-CM

## 2011-10-18 MED ORDER — NAPROXEN 500 MG PO TABS: 500.0000 mg | ORAL_TABLET | Freq: Two times a day (BID) | ORAL | Status: DC

## 2011-10-18 NOTE — Progress Notes (Signed)
Subjective:   Patient ID: Isaiah Gomez male   DOB: 03-25-1966 46 y.o.   MRN: [id number]  HPI: [name] is a 46 y.o. male with PMH significant for HLD, degenerative disc disease, who presents for an acute visit for headache.  Patient's headache started at yesterday morning. He reports that when he woke up at morning, he started having pounding like headache. His headache is located at frontal area and also behind eyes, non-radiating, 4/10 in severity. It is aggravated with exertion. He tried ibuprofen and tylenol without significant help at home. It is not associated with vision or hearing change, or nausea and vomiting, or weakness and numbness, or nasal discharge, or facial pain, or running nose, or cough, or sore throat. He dose not have family history of migraine headache.   Denies head injury and sick contact, no changes in his life style or working hours. No changes in his medications. Denies using drugs or drinking alcohol.  Denies fever, chills, fatigue, abdominal pain,diarrhea, constipation, dysuria, urgency, frequency, hematuria, leg swelling.    Past Medical History  Diagnosis Date  . Dyslipidemia   . Hypertension   . Sleep apnoea, obstructed     mild  . Erectile dysfunction    Current Outpatient Prescriptions  Medication Sig Dispense Refill  . aspirin 81 MG tablet Take 81 mg by mouth daily.        . benazepril (LOTENSIN) 10 MG tablet Take 1 tablet (10 mg total) by mouth daily.  30 tablet  5  . Niacin CR 1000 MG TBCR Take 1 tablet by mouth daily.        Marland Kitchen PARoxetine (PAXIL) 20 MG tablet TAKE 1 TABLET BY MOUTH EVERY DAY  30 tablet  2  . pravastatin (PRAVACHOL) 40 MG tablet Take 1 tablet (40 mg total) by mouth every evening.  30 tablet  11  . naproxen (NAPROSYN) 500 MG tablet Take 1 tablet (500 mg total) by mouth 2 (two) times daily with a meal.  60 tablet  2   Family History  Problem Relation Age of Onset  . Diabetes Mother   . Hypertension Mother   . Cancer  Mother     colon cancer dx 3 years ago  . Coronary artery disease Father   . Hypertension Father   . Valvular heart disease Father     h/o tricuspid valve replacement surgery  . Coronary artery disease Sister     age of onset <60 in 18st degree male relatives   History   Social History  . Marital Status: Married    Spouse Name: N/A    Number of Children: N/A  . Years of Education: N/A   Social History Main Topics  . Smoking status: Current Everyday Smoker -- 0.3 packs/day for 20 years    Types: Cigarettes  . Smokeless tobacco: None   Comment: cut down from 1PAD to 5 cig daily since 11/12.  Marland Kitchen Alcohol Use: Yes     occasional beer  . Drug Use: No  . Sexually Active: None   Other Topics Concern  . None   Social History Narrative   Works in a Geophysical data processor, solvents)   Review of Systems:  General: no fevers, chills, no changes in body weight, no changes in appetite Skin: no rash HEENT: has headache. No blurry vision, hearing changes or sore throat Pulm: no dyspnea, coughing, wheezing CV: no chest pain, palpitations, shortness of breath Abd: no nausea/vomiting, abdominal pain, diarrhea/constipation GU: no dysuria, hematuria, polyuria  Ext: no arthralgias, myalgias Neuro: no weakness, numbness, or tingling  Objective:  Physical Exam: Filed Vitals:   10/18/11 1333  BP: 123/70  Pulse: 71  Temp: 97.6 F (36.4 C)  TempSrc: Oral  Height: 5\' 9"  (1.753 m)  Weight: 206 lb 8 oz (93.668 kg)  SpO2: 97%   General: not in acute distress HEENT: PERRL, EOMI, no scleral icterus Cardiac: S1/S2, RRR, No murmurs, gallops or rubs Pulm: Good air movement bilaterally, Clear to auscultation bilaterally, No rales, wheezing, rhonchi or rubs. Abd: Soft,  nondistended, nontender, no rebound pain, no organomegaly, BS present Ext: No rashes or edema, 2+DP/PT pulse bilaterally Neuro: alert and oriented X3, cranial nerves II-XII grossly intact, muscle strength 5/5 in all  extremeties,  sensation to light touch intact. Negative Kernig's sign and No Brudzinski sign.  Assessment & Plan:   # Headache: etiology is not clear. It is likely due to tension headache. Other differential diagnosis includes, but less likely, meningitis (no fever or chills, no meningeal signs), SAH (not as severe as typical sudden onset headache for [facility]), cluster headache (No typical symptoms, such associated tearing, unilateral headache). Migraine headache can not be completely ruled out, but again not typical for migraine (without aura, not unilateral). Currently, there is no alarming signs for serious problem. Neurological exam no significant abnormalities. Will treat him symptomatically with naproxen. Patient is instructed to come back immediately if gets worse.   # HTN: well controlled. His bp is 123/70. Will continue current regimen.  # HLD: his LDL was 93 on 03/11/11. His AST and ALT were normal on 03/11/11. Will continue the current regimen.  Lorretta Harp

## 2011-10-18 NOTE — Patient Instructions (Addendum)
1. Please take all medications as prescribed. Please stop taking Voltaren when you take Naproxen. Please take Naproxen only when you need it for headache. 2. If you have worsening of your symptoms or new symptoms arise, please call the clinic [phone]), or go to the ER immediately if symptoms are severe. 3.

## 2011-11-01 ENCOUNTER — Encounter: Payer: BC Managed Care – PPO | Admitting: Internal Medicine

## 2011-12-15 ENCOUNTER — Other Ambulatory Visit: Payer: Self-pay | Admitting: Internal Medicine

## 2011-12-23 ENCOUNTER — Other Ambulatory Visit: Payer: Self-pay | Admitting: *Deleted

## 2011-12-23 MED ORDER — BENAZEPRIL HCL 10 MG PO TABS: 10.0000 mg | ORAL_TABLET | Freq: Every day | ORAL | Status: DC

## 2012-01-13 ENCOUNTER — Encounter: Payer: BC Managed Care – PPO | Admitting: Internal Medicine

## 2012-03-02 ENCOUNTER — Other Ambulatory Visit: Payer: Self-pay | Admitting: *Deleted

## 2012-03-03 MED ORDER — BENAZEPRIL HCL 10 MG PO TABS: 10.0000 mg | ORAL_TABLET | Freq: Every day | ORAL | Status: DC

## 2012-03-05 ENCOUNTER — Other Ambulatory Visit: Payer: Self-pay | Admitting: *Deleted

## 2012-03-05 DIAGNOSIS — E785 Hyperlipidemia, unspecified: Secondary | ICD-10-CM

## 2012-03-05 MED ORDER — PRAVASTATIN SODIUM 40 MG PO TABS: 40.0000 mg | ORAL_TABLET | Freq: Every evening | ORAL | Status: DC

## 2012-04-09 ENCOUNTER — Ambulatory Visit (INDEPENDENT_AMBULATORY_CARE_PROVIDER_SITE_OTHER): Payer: BC Managed Care – PPO | Admitting: Internal Medicine

## 2012-04-09 ENCOUNTER — Encounter: Payer: Self-pay | Admitting: Internal Medicine

## 2012-04-09 VITALS — BP 135/81 | HR 78 | Temp 98.9°F | Resp 20 | Ht 68.0 in | Wt 206.3 lb

## 2012-04-09 DIAGNOSIS — E785 Hyperlipidemia, unspecified: Secondary | ICD-10-CM

## 2012-04-09 DIAGNOSIS — F524 Premature ejaculation: Secondary | ICD-10-CM

## 2012-04-09 DIAGNOSIS — F17200 Nicotine dependence, unspecified, uncomplicated: Secondary | ICD-10-CM

## 2012-04-09 DIAGNOSIS — F528 Other sexual dysfunction not due to a substance or known physiological condition: Secondary | ICD-10-CM

## 2012-04-09 DIAGNOSIS — I10 Essential (primary) hypertension: Secondary | ICD-10-CM

## 2012-04-09 LAB — COMPREHENSIVE METABOLIC PANEL
ALT: 22 U/L (ref 0–53)
Albumin: 4.2 g/dL (ref 3.5–5.2)
CO2: 28 mEq/L (ref 19–32)
Chloride: 104 mEq/L (ref 96–112)
Potassium: 4.1 mEq/L (ref 3.5–5.3)
Sodium: 139 mEq/L (ref 135–145)
Total Bilirubin: 0.3 mg/dL (ref 0.3–1.2)
Total Protein: 6.7 g/dL (ref 6.0–8.3)

## 2012-04-09 LAB — LIPID PANEL: Cholesterol: 223 mg/dL — ABNORMAL HIGH (ref 0–200)

## 2012-04-09 MED ORDER — VARENICLINE TARTRATE 0.5 MG PO TABS: ORAL_TABLET | ORAL | Status: DC

## 2012-04-09 MED ORDER — BENAZEPRIL HCL 10 MG PO TABS: 10.0000 mg | ORAL_TABLET | Freq: Every day | ORAL | Status: DC

## 2012-04-09 MED ORDER — PAROXETINE HCL 20 MG PO TABS: 20.0000 mg | ORAL_TABLET | Freq: Every day | ORAL | Status: DC

## 2012-04-09 MED ORDER — PRAVASTATIN SODIUM 40 MG PO TABS: 40.0000 mg | ORAL_TABLET | Freq: Every evening | ORAL | Status: DC

## 2012-04-09 NOTE — Assessment & Plan Note (Addendum)
Patient would like to try Chantix.  He understands risks, and knows to stop taking medications and call clinic as soon as possible should he experience mood changes or SI.  Chantix script sent to pharmacy with taper up.  Patient to continue medication for 12 weeks and call with results.  If he has successfully quit smoking, we will prescribe 12 more weeks of Chantix to maintain success.  Given h/o smoking, offered pneumovax, but patient declined at present.

## 2012-04-09 NOTE — Assessment & Plan Note (Signed)
Improved with Paxil.  Continue.  No SI/HI.  Improved mood.

## 2012-04-09 NOTE — Progress Notes (Signed)
  Subjective:   Patient ID: Isaiah Gomez male   DOB: April 14, 1966 46 y.o.   MRN: [id number]  HPI: [name] is a 46 y.o. man with h/o HLD, HTN, ED and tobacco abuse who presents today for routine follow up without any complaints.  Since I last saw him, he underwent complete tooth extraction and now has upper and lower dentures in Nov 2012 - he tells me he had gingivitis.  He is adjusting to dentures.  He is compliant with all his meds, and ran out of medications 2 days prior.  He notes his premature ejaculation has improved since continuing Paxil, as has his mood.  He is interested in smoking cessation, and asks about Chantix.  He does not think the patch is useful because your cannot continue smoking (tapering cigarettes down) and the [facility] patches do not seem to stick well.  He did quit smoking for 2 months after mouth surgery (teeth extractions), but then restarted after stressors between him and his wife.  Review of Systems: Constitutional: Denies fever, chills, diaphoresis, appetite change and fatigue.  HEENT: Denies photophobia, eye pain, redness, hearing loss, ear pain, congestion, sore throat, rhinorrhea, sneezing, mouth sores, trouble swallowing, neck pain, neck stiffness and tinnitus.   Respiratory: Denies SOB, DOE, cough, chest tightness,  and wheezing.   Cardiovascular: Denies chest pain, palpitations and leg swelling.  Gastrointestinal: Denies nausea, vomiting, abdominal pain, diarrhea, constipation, blood in stool and abdominal distention.  Genitourinary: Denies dysuria, urgency, frequency, hematuria, flank pain and difficulty urinating.  Musculoskeletal: Denies myalgias, back pain, joint swelling, arthralgias and gait problem.  Skin: Denies pallor, rash and wound.  Neurological: Denies dizziness, seizures, syncope, weakness, light-headedness, numbness and headaches.  Psychiatric/Behavioral: Denies suicidal ideation, mood changes, confusion, nervousness, sleep disturbance  and agitation  Objective:  Physical Exam: Filed Vitals:   04/09/12 1623  BP: 135/81  Pulse: 78  Temp: 98.9 F (37.2 C)  TempSrc: Oral  Resp: 20  Height: 5\' 8"  (1.727 m)  Weight: 206 lb 4.8 oz (93.577 kg)  SpO2: 96%   Constitutional: Vital signs reviewed.  Patient is a well-developed and well-nourished man in no acute distress and cooperative with exam.  Mouth: no erythema or exudates, MMM, upper and lower dentures Eyes: PERRL, EOMI, conjunctivae normal, No scleral icterus.   Cardiovascular: RRR, S1 normal, S2 normal, no MRG, pulses symmetric and intact bilaterally Pulmonary/Chest: CTAB, no wheezes, rales, or rhonchi Abdominal: Soft. Non-tender, non-distended, bowel sounds are normal, no masses, organomegaly, or guarding present.  GU: no CVA tenderness Musculoskeletal: No joint deformities, erythema, or stiffness, ROM full and no nontender Neurological: A&O x3, Strength is normal and symmetric bilaterally, cranial nerve II-XII are grossly intact, no focal motor deficit, sensory intact to light touch bilaterally.  Skin: Warm, dry and intact. No rash, cyanosis, or clubbing.  Psychiatric: Normal mood and affect. speech and behavior is normal. Judgment and thought content normal. Cognition and memory are normal.   Assessment & Plan:  Case and care discussed with Dr. Criselda Peaches.  Patient to return in 1 year, or sooner if needed.  He should call in 12 weeks and inform us if he is still smoking, in which case we will prescribe 12 more weeks of chantix for success maintenance. Please see problem oriented charting for further details.

## 2012-04-09 NOTE — Assessment & Plan Note (Signed)
Lab Results  Component Value Date   NA 139 04/09/2012   K 4.1 04/09/2012   CL 104 04/09/2012   CO2 28 04/09/2012   BUN 13 04/09/2012   CREATININE 1.00 04/09/2012   CREATININE 0.89 12/13/2008    BP Readings from Last 3 Encounters:  04/09/12 135/81  10/18/11 123/70  05/23/11 119/79    Assessment: Hypertension control:  controlled  Progress toward goals:  at goal Barriers to meeting goals:  no barriers identified  Plan: Hypertension treatment:  continue current medications

## 2012-04-09 NOTE — Patient Instructions (Addendum)
I have sent Chantix to your pharmacy to help with quitting smoking. Days 1-3: 0.5 mg (1 tablet) once daily Days 4-7: 0.5 mg (1 tablet) twice daily Starting Day 8: 1mg  (2 tablets) twice daily for 11 weeks. Start 1 week before target quit date.  If you successfully quit smoking at the end of 12 weeks, call us so we can continue Chantix for another 12 weeks to help maintain success! If you have any suicidal thoughts, or other side effects, please stop taking medication and call the clinic as soon as possible.  I have refilled your Pravastatin, Paxil and benazepril.  I will call you if needed with your lab results.  Please be sure to bring all of your medications with you to every visit.  Should you have any new or worsening symptoms, please be sure to call the clinic at [phone].

## 2012-04-09 NOTE — Assessment & Plan Note (Signed)
Lipid panel today. Continue Pravastatin 40.  Tolerating medication well.

## 2012-04-10 NOTE — Addendum Note (Signed)
Addended by: Alanson Puls on: 04/10/2012 09:25 AM   Modules accepted: Orders

## 2012-04-13 ENCOUNTER — Other Ambulatory Visit: Payer: Self-pay | Admitting: Internal Medicine

## 2012-04-13 DIAGNOSIS — E785 Hyperlipidemia, unspecified: Secondary | ICD-10-CM

## 2012-04-13 MED ORDER — ROSUVASTATIN CALCIUM 10 MG PO TABS: 10.0000 mg | ORAL_TABLET | Freq: Every day | ORAL | Status: DC

## 2012-04-13 NOTE — Progress Notes (Signed)
Patients LDL = 149 on 04/10/12.  Patient has been compliant with Pravastatin 40.    Plan to change to Rosuvastatin 10mg  qHS.  Recheck Lipid panel in 1-2 months after initiation of therapy. Called patient at 9:30am on 04/13/12, no answer.  Left message. Will follow up.  Called again at 6:30pm.  No answer.  Left message for patient to return in 2 months for lab visit to recheck Lipid profile.

## 2012-07-07 ENCOUNTER — Encounter: Payer: Self-pay | Admitting: Internal Medicine

## 2012-07-07 ENCOUNTER — Ambulatory Visit (INDEPENDENT_AMBULATORY_CARE_PROVIDER_SITE_OTHER): Payer: BC Managed Care – PPO | Admitting: Internal Medicine

## 2012-07-07 VITALS — BP 152/82 | HR 77 | Temp 98.2°F | Ht 69.0 in | Wt 209.8 lb

## 2012-07-07 DIAGNOSIS — E785 Hyperlipidemia, unspecified: Secondary | ICD-10-CM

## 2012-07-07 DIAGNOSIS — I10 Essential (primary) hypertension: Secondary | ICD-10-CM

## 2012-07-07 DIAGNOSIS — R51 Headache: Secondary | ICD-10-CM

## 2012-07-07 DIAGNOSIS — M5380 Other specified dorsopathies, site unspecified: Secondary | ICD-10-CM

## 2012-07-07 DIAGNOSIS — M62830 Muscle spasm of back: Secondary | ICD-10-CM

## 2012-07-07 LAB — LIPID PANEL
Cholesterol: 178 mg/dL (ref 0–200)
HDL: 37 mg/dL — ABNORMAL LOW (ref >39)
Total CHOL/HDL Ratio: 4.8 Ratio
Triglycerides: 254 mg/dL — ABNORMAL HIGH (ref <150)
VLDL: 51 mg/dL — ABNORMAL HIGH (ref 0–40)

## 2012-07-07 MED ORDER — CYCLOBENZAPRINE HCL 5 MG PO TABS: 5.0000 mg | ORAL_TABLET | Freq: Three times a day (TID) | ORAL | Status: DC | PRN

## 2012-07-07 MED ORDER — NAPROXEN 500 MG PO TABS: 500.0000 mg | ORAL_TABLET | Freq: Two times a day (BID) | ORAL | Status: AC

## 2012-07-07 NOTE — Assessment & Plan Note (Signed)
After last visit, pravastatin was changed to crestor - repeat lipid panel today.

## 2012-07-07 NOTE — Assessment & Plan Note (Signed)
Back pain most likely muscular in nature.  Less likely to be ruptured disk given description.  Trial of flexeril and naproxen.  RTC if persists or no improvement.

## 2012-07-07 NOTE — Patient Instructions (Addendum)
-  Let's do a trial of Cyclobenzaprine 5-10mg  three times daily as needed, as well as naproxen 500mg  twice daily for pain relief.  I suspect you have a muscle spasm.  Please be sure to bring all of your medications with you to every visit.  Should you have any new or worsening symptoms, please be sure to call the clinic at [phone].

## 2012-07-07 NOTE — Assessment & Plan Note (Signed)
Patient compliant with meds, likely elevated in setting of pain - no change in mgmt at this time.

## 2012-07-07 NOTE — Progress Notes (Signed)
Subjective:   Patient ID: Isaiah Gomez male   DOB: Apr 27, 1966 46 y.o.   MRN: [id number]  HPI: Isaiah Gomez is a 46 y.o. man with history of HTN and HLD who presents with acute low back pain.  Woke up Sunday morning with low back pain, radiates down both legs when walking (muscles feel sore and soreness goes away as soon as he sits down). Wrose sharp pain (fleeting) with mvmt - at worse 10/10, but when resting 2/10.  Tried heat and ice. Has tried ibuprofen 800 x 3 doses. No meds on Sunday. Similar in the past, but worse today.  Difficulty with standing straight up. No urinary/bowel incontinence. No sharp radiating/numbness/tingling. No weakness.  Pain began at rest -no trauma/inciting event.   Past Medical History  Diagnosis Date  . Dyslipidemia   . Hypertension   . Sleep apnoea, obstructed     mild  . Erectile dysfunction   . Amblyopia     right eye- surgery done during childhood   Current Outpatient Prescriptions  Medication Sig Dispense Refill  . aspirin 81 MG tablet Take 81 mg by mouth daily.        . benazepril (LOTENSIN) 10 MG tablet Take 1 tablet (10 mg total) by mouth daily.  90 tablet  3  . naproxen (NAPROSYN) 500 MG tablet Take 1 tablet (500 mg total) by mouth 2 (two) times daily with a meal.  60 tablet  2  . Niacin CR 1000 MG TBCR Take 1 tablet by mouth daily.        Marland Kitchen PARoxetine (PAXIL) 20 MG tablet Take 1 tablet (20 mg total) by mouth daily.  90 tablet  3  . rosuvastatin (CRESTOR) 10 MG tablet Take 1 tablet (10 mg total) by mouth at bedtime.  30 tablet  11  . varenicline (CHANTIX) 0.5 MG tablet Days 1-3 take 0.5 mg (1 tab) once daily; Days 4-7 take 0.5mg  (1 tab) twice daily; starting day 8, take 1mg  (2 tab) twice daily for 11 weeks.  90 tablet  4   Family History  Problem Relation Age of Onset  . Diabetes Mother   . Hypertension Mother   . Cancer Mother     colon cancer dx 3 years ago  . Coronary artery disease Father   . Hypertension Father   . Valvular heart  disease Father     h/o tricuspid valve replacement surgery  . Coronary artery disease Sister     age of onset <60 in 43st degree male relatives   History   Social History  . Marital Status: Married    Spouse Name: N/A    Number of Children: N/A  . Years of Education: N/A   Social History Main Topics  . Smoking status: Current Every Day Smoker -- 0.3 packs/day for 20 years    Types: Cigarettes  . Smokeless tobacco: None   Comment: cut down from 1PAD to 5 cig daily since 11/12.Wants to talk about Chantix - back up to 1 ppd  . Alcohol Use: Yes     occasional beer  . Drug Use: No  . Sexually Active: Yes   Other Topics Concern  . 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  HEENT: Denies photophobia, eye pain, redness, hearing loss, ear pain, congestion, sore throat, rhinorrhea, sneezing, mouth sores, trouble swallowing, neck pain, neck stiffness and tinnitus.  Respiratory: Denies SOB, DOE, cough, chest tightness,  and wheezing.   Cardiovascular: Denies chest pain, palpitations and leg swelling.  Gastrointestinal: Denies nausea, vomiting, abdominal pain, diarrhea, constipation, blood in stool and abdominal distention.  Genitourinary: Denies dysuria, urgency, frequency, hematuria, flank pain and difficulty urinating.  Musculoskeletal: Denies joint swelling, arthralgias and gait problem.  Skin: Denies pallor, rash and wound.  Neurological: Denies dizziness, seizures, syncope, weakness, light-headedness, numbness and headaches.  Psychiatric/Behavioral: Denies suicidal ideation, mood changes, confusion, nervousness, sleep disturbance and agitation  Objective:  Physical Exam: Filed Vitals:   07/07/12 1508  BP: 152/82  Pulse: 77  Temp: 98.2 F (36.8 C)  TempSrc: Oral  Height: 5\' 9"  (1.753 m)  Weight: 209 lb 12.8 oz (95.165 kg)  SpO2:  97%   Constitutional: Vital signs reviewed.  Patient is a well-developed and well-nourished man in no acute distress and cooperative with exam. Head: Normocephalic and atraumatic Mouth: no erythema or exudates, MMM Eyes: PERRL, EOMI, conjunctivae normal, No scleral icterus.  Neck: Supple, Trachea midline normal ROM, No JVD, mass, thyromegaly, or carotid bruit present.  Cardiovascular: RRR, S1 normal, S2 normal, no MRG, pulses symmetric and intact bilaterally Pulmonary/Chest: CTAB, no wheezes, rales, or rhonchi Abdominal: Soft. Non-tender, non-distended, bowel sounds are normal, no masses, organomegaly, or guarding present.  Musculoskeletal: some tenderness to paraspinal lumbar region muscles - but fleeting and worse with movement, patient has limited ROM of back with flexion/extension and rotation d/t pain, palpable contracture of right lumbar paraspinal muscles Neurological: A&O x3, Strength is normal and symmetric bilaterally, cranial nerve II-XII are grossly intact, no focal motor deficit, sensory intact to light touch bilaterally.  Skin: Warm, dry and intact. No rash, cyanosis, or clubbing.  Psychiatric: Normal mood and affect. speech and behavior is normal. Judgment and thought content normal. Cognition and memory are normal.   Assessment & Plan:  Case and care discussed with Dr. Rogelia Boga. Please see problem oriented charting for further details.  BP likely elevated d/t pain.

## 2012-07-10 ENCOUNTER — Telehealth: Payer: Self-pay | Admitting: *Deleted

## 2012-07-10 NOTE — Telephone Encounter (Signed)
Called patient.  No change.  Similar to past episodes of LBP.  No red flags.  This is common back spasm syndrome and he has only been on rx for 2 days this time.  Advised waiting several weeks unless new sx occur.

## 2012-07-10 NOTE — Telephone Encounter (Signed)
Pt seen in clinic on 10/29 for c/o Low back pain with radiation. He was treated with Naproxen and flexeril. He calls today stating back is still hurting, meds not helping. Should he give this longer or do you want him to RTC? Pt # [id number]

## 2013-09-07 ENCOUNTER — Encounter: Payer: Self-pay | Admitting: Internal Medicine

## 2013-11-17 ENCOUNTER — Encounter: Payer: BC Managed Care – PPO | Admitting: Internal Medicine

## 2013-11-19 ENCOUNTER — Ambulatory Visit: Payer: Self-pay

## 2013-11-19 ENCOUNTER — Other Ambulatory Visit: Payer: Self-pay | Admitting: Occupational Medicine

## 2013-11-19 DIAGNOSIS — Z0000 Encounter for general adult medical examination without abnormal findings: Secondary | ICD-10-CM

## 2013-11-24 ENCOUNTER — Encounter: Payer: BC Managed Care – PPO | Admitting: Internal Medicine

## 2014-04-07 ENCOUNTER — Encounter (HOSPITAL_COMMUNITY): Payer: Self-pay | Admitting: Emergency Medicine

## 2014-04-07 ENCOUNTER — Emergency Department (INDEPENDENT_AMBULATORY_CARE_PROVIDER_SITE_OTHER)
Admission: EM | Admit: 2014-04-07 | Discharge: 2014-04-07 | Disposition: A | Discharge status: Home / Self Care | Payer: BC Managed Care – PPO | Source: Home / Self Care | Attending: Family Medicine | Admitting: Family Medicine

## 2014-04-07 DIAGNOSIS — M545 Low back pain, unspecified: Secondary | ICD-10-CM

## 2014-04-07 LAB — POCT URINALYSIS DIP (DEVICE)
Bilirubin Urine: NEGATIVE
Glucose, UA: NEGATIVE mg/dL
HGB URINE DIPSTICK: NEGATIVE
Ketones, ur: NEGATIVE mg/dL
LEUKOCYTES UA: NEGATIVE
Nitrite: NEGATIVE
PH: 6.5 (ref 5.0–8.0)
PROTEIN: NEGATIVE mg/dL
Specific Gravity, Urine: 1.025 (ref 1.005–1.030)
Urobilinogen, UA: 0.2 mg/dL (ref 0.0–1.0)

## 2014-04-07 MED ORDER — MELOXICAM 15 MG PO TABS
15.0000 mg | ORAL_TABLET | Freq: Every day | ORAL | Status: DC
Start: 2014-04-07 — End: 2016-10-22

## 2014-04-07 MED ORDER — HYDROCODONE-ACETAMINOPHEN 5-325 MG PO TABS: 1.0000 | ORAL_TABLET | Freq: Four times a day (QID) | ORAL | Status: DC | PRN

## 2014-04-07 NOTE — ED Provider Notes (Signed)
CSN: [id number]     Arrival date & time 04/07/14  1508 History   First MD Initiated Contact with Patient 04/07/14 1531     Chief Complaint  Patient presents with  . Back Pain   (Consider location/radiation/quality/duration/timing/severity/associated sxs/prior Treatment) HPI Comments: [age] year old male presents for evaluation of low back pain that started 3 days ago. He denies any injury and does not know what he may have done to cause this. He is low back pain midline in the lower back, with some pain in his left upper thigh. It is constant and worse with any particular movements, somewhat relieved by rest. He denies any numbness or weakness, loss of bowel or bladder control, history of IV drug use. No other systemic symptoms. He has a history of degenerative disc disease diagnosed in 2008 but he has not had any significant back pain since then and does not take any medications for back pain  Patient is a 48 y.o. male presenting with back pain.  Back Pain   Past Medical History  Diagnosis Date  . Dyslipidemia   . Hypertension   . Sleep apnoea, obstructed     mild  . Erectile dysfunction   . Amblyopia     right eye- surgery done during childhood   Past Surgical History  Procedure Laterality Date  . Multiple tooth extractions  07/2011    all teeth extracted and dentures placed   Family History  Problem Relation Age of Onset  . Diabetes Mother   . Hypertension Mother   . Cancer Mother     colon cancer dx 3 years ago  . Coronary artery disease Father   . Hypertension Father   . Valvular heart disease Father     h/o tricuspid valve replacement surgery  . Coronary artery disease Sister     age of onset <60 in 771st degree male relatives   History  Substance Use Topics  . Smoking status: Current Every Day Smoker -- 1.00 packs/day for 20 years    Types: Cigarettes  . Smokeless tobacco: Not on file     Comment: cut down from 1PAD to 5 cig daily since 11/12.Wants to talk about  Chantix - back up to 1 ppd  . Alcohol Use: Yes     Comment: occasional beer    Review of Systems  Musculoskeletal: Positive for back pain and myalgias.  All other systems reviewed and are negative.   Allergies  Prednisone  Home Medications   Prior to Admission medications   Medication Sig Start Date End Date Taking? Authorizing Provider  aspirin 81 MG tablet Take 81 mg by mouth daily.      Historical Provider, MD  benazepril (LOTENSIN) 10 MG tablet Take 1 tablet (10 mg total) by mouth daily. 04/09/12   Neema Davina PokeK Sharda, MD  cyclobenzaprine (FLEXERIL) 5 MG tablet Take 1-2 tablets (5-10 mg total) by mouth 3 (three) times daily as needed for muscle spasms. 07/07/12   Neema Davina PokeK Sharda, MD  HYDROcodone-acetaminophen (NORCO) 5-325 MG per tablet Take 1 tablet by mouth every 6 (six) hours as needed for moderate pain. 04/07/14   Graylon GoodZachary H Kashish Yglesias, PA-C  meloxicam (MOBIC) 15 MG tablet Take 1 tablet (15 mg total) by mouth daily. 04/07/14   Graylon GoodZachary H Kassi Esteve, PA-C  Niacin CR 1000 MG TBCR Take 1 tablet by mouth daily.      Historical Provider, MD  PARoxetine (PAXIL) 20 MG tablet Take 1 tablet (20 mg total) by mouth daily. 04/09/12   Neema  Davina Poke, MD  rosuvastatin (CRESTOR) 10 MG tablet Take 1 tablet (10 mg total) by mouth at bedtime. 04/13/12 04/13/13  Belia Heman, MD  varenicline (CHANTIX) 0.5 MG tablet Days 1-3 take 0.5 mg (1 tab) once daily; Days 4-7 take 0.5mg  (1 tab) twice daily; starting day 8, take 1mg  (2 tab) twice daily for 11 weeks. 04/09/12   Belia Heman, MD   There were no vitals taken for this visit. Physical Exam  Nursing note and vitals reviewed. Constitutional: He is oriented to person, place, and time. He appears well-developed and well-nourished. No distress.  HENT:  Head: Normocephalic.  Pulmonary/Chest: Effort normal. No respiratory distress.  Musculoskeletal:       Lumbar back: He exhibits bony tenderness (mild point tenderness at about L2-L3) and pain. He exhibits normal range of  motion, no swelling, no edema, no deformity and no spasm.  Neurological: He is alert and oriented to person, place, and time. He has normal strength and normal reflexes. No cranial nerve deficit or sensory deficit. He exhibits normal muscle tone. Coordination normal. GCS eye subscore is 4. GCS verbal subscore is 5. GCS motor subscore is 6.  Skin: Skin is warm and dry. No rash noted. He is not diaphoretic.  Psychiatric: He has a normal mood and affect. Judgment normal.    ED Course  Procedures (including critical care time) Labs Review Labs Reviewed  POCT URINALYSIS DIP (DEVICE)    Imaging Review No results found.   MDM   1. Midline low back pain without sciatica    Low back pain, mild to moderate, no red flags. Treat with daily meloxicam and norco when necessary for pain. Followup as needed.   Meds ordered this encounter  Medications  . meloxicam (MOBIC) 15 MG tablet    Sig: Take 1 tablet (15 mg total) by mouth daily.    Dispense:  30 tablet    Refill:  2    Order Specific Question:  Supervising Provider    Answer:  Clementeen Graham, S [id number]  . HYDROcodone-acetaminophen (NORCO) 5-325 MG per tablet    Sig: Take 1 tablet by mouth every 6 (six) hours as needed for moderate pain.    Dispense:  12 tablet    Refill:  0    Order Specific Question:  Supervising Provider    Answer:  Clementeen Graham, Kathie Rhodes [[id number]]       Graylon Good, PA-C 04/07/14 [phone]

## 2014-04-07 NOTE — Discharge Instructions (Signed)

## 2014-04-07 NOTE — ED Notes (Signed)
Pt c/o lower back pain since Monday Denies inj/trauma, urinary sx Pain is 5/10 and increases w/activity; radiates to left leg Ambulated well to exam room w/NAD; alert w/no signs of acute distress.

## 2014-04-08 NOTE — ED Provider Notes (Signed)
Medical screening examination/treatment/procedure(s) were performed by a resident physician or non-physician practitioner and as the supervising physician I was immediately available for consultation/collaboration.  Yvonne Petite, MD    Jestina Stephani S Murel Shenberger, MD 04/08/14 0728 

## 2015-09-02 ENCOUNTER — Emergency Department (HOSPITAL_COMMUNITY)
Admission: EM | Admit: 2015-09-02 | Discharge: 2015-09-02 | Disposition: A | Discharge status: Home / Self Care | Payer: BLUE CROSS/BLUE SHIELD | Attending: Physician Assistant | Admitting: Physician Assistant

## 2015-09-02 ENCOUNTER — Encounter (HOSPITAL_COMMUNITY): Payer: Self-pay

## 2015-09-02 ENCOUNTER — Emergency Department (HOSPITAL_COMMUNITY): Payer: BLUE CROSS/BLUE SHIELD

## 2015-09-02 DIAGNOSIS — E785 Hyperlipidemia, unspecified: Secondary | ICD-10-CM | POA: Diagnosis not present

## 2015-09-02 DIAGNOSIS — I10 Essential (primary) hypertension: Secondary | ICD-10-CM | POA: Insufficient documentation

## 2015-09-02 DIAGNOSIS — Z87438 Personal history of other diseases of male genital organs: Secondary | ICD-10-CM | POA: Diagnosis not present

## 2015-09-02 DIAGNOSIS — Z8669 Personal history of other diseases of the nervous system and sense organs: Secondary | ICD-10-CM | POA: Diagnosis not present

## 2015-09-02 DIAGNOSIS — F17210 Nicotine dependence, cigarettes, uncomplicated: Secondary | ICD-10-CM | POA: Insufficient documentation

## 2015-09-02 DIAGNOSIS — G5712 Meralgia paresthetica, left lower limb: Secondary | ICD-10-CM

## 2015-09-02 DIAGNOSIS — M25552 Pain in left hip: Secondary | ICD-10-CM | POA: Diagnosis present

## 2015-09-02 LAB — BASIC METABOLIC PANEL
ANION GAP: 5 (ref 5–15)
BUN: 13 mg/dL (ref 6–20)
CHLORIDE: 108 mmol/L (ref 101–111)
CO2: 28 mmol/L (ref 22–32)
Calcium: 9.2 mg/dL (ref 8.9–10.3)
Creatinine, Ser: 0.81 mg/dL (ref 0.61–1.24)
Glucose, Bld: 99 mg/dL (ref 65–99)
POTASSIUM: 4.4 mmol/L (ref 3.5–5.1)
SODIUM: 141 mmol/L (ref 135–145)

## 2015-09-02 LAB — CBC WITH DIFFERENTIAL/PLATELET
BASOS ABS: 0.1 10*3/uL (ref 0.0–0.1)
Basophils Relative: 1 %
EOS ABS: 0.3 10*3/uL (ref 0.0–0.7)
EOS PCT: 4 %
HCT: 46.4 % (ref 39.0–52.0)
HEMOGLOBIN: 16 g/dL (ref 13.0–17.0)
Lymphocytes Relative: 39 %
Lymphs Abs: 3.1 10*3/uL (ref 0.7–4.0)
MCH: 30.4 pg (ref 26.0–34.0)
MCHC: 34.5 g/dL (ref 30.0–36.0)
MCV: 88.2 fL (ref 78.0–100.0)
Monocytes Absolute: 0.7 10*3/uL (ref 0.1–1.0)
Monocytes Relative: 9 %
NEUTROS PCT: 47 %
Neutro Abs: 3.8 10*3/uL (ref 1.7–7.7)
PLATELETS: 176 10*3/uL (ref 150–400)
RBC: 5.26 MIL/uL (ref 4.22–5.81)
RDW: 12.2 % (ref 11.5–15.5)
WBC: 7.9 10*3/uL (ref 4.0–10.5)

## 2015-09-02 MED ORDER — NAPROXEN 500 MG PO TABS
500.0000 mg | ORAL_TABLET | Freq: Once | ORAL | Status: AC
  Administered 2015-09-02: 500 mg via ORAL
  Filled 2015-09-02: qty 1

## 2015-09-02 MED ORDER — OXYCODONE-ACETAMINOPHEN 5-325 MG PO TABS: 2.0000 | ORAL_TABLET | ORAL | Status: DC | PRN

## 2015-09-02 NOTE — ED Provider Notes (Signed)
CSN: [id number]     Arrival date & time 09/02/15  1040 History   First MD Initiated Contact with Patient 09/02/15 1121     Chief Complaint  Patient presents with  . Hip Pain   (Consider location/radiation/quality/duration/timing/severity/associated sxs/prior Treatment) Patient is a 49 y.o. male presenting with hip pain. The history is provided by the patient. No language interpreter was used.  Hip Pain Associated symptoms include arthralgias, numbness and weakness. Pertinent negatives include no fever.  Isaiah Gomez is a 49 y.o male with a history of hyperlipidemia, hypertension, sleep apnea, and erectile dysfunction who presents for left hip pain that has been worsening over the past 3-4 days. He denies any recent injury or fall. He states that it is worse with sitting and feels that his leg is intermittently numb with throbbing pain. He has been taking Tylenol with little relief. He reports increased weakness in the left leg.  He denies any fever, night sweats, history of cancer, bowel or bladder incontinence or retention, or back pain or knee pain.  Past Medical History  Diagnosis Date  . Dyslipidemia   . Hypertension   . Sleep apnoea, obstructed     mild  . Erectile dysfunction   . Amblyopia     right eye- surgery done during childhood   Past Surgical History  Procedure Laterality Date  . Multiple tooth extractions  07/2011    all teeth extracted and dentures placed   Family History  Problem Relation Age of Onset  . Diabetes Mother   . Hypertension Mother   . Cancer Mother     colon cancer dx 3 years ago  . Coronary artery disease Father   . Hypertension Father   . Valvular heart disease Father     h/o tricuspid valve replacement surgery  . Coronary artery disease Sister     age of onset <60 in 451st degree male relatives   Social History  Substance Use Topics  . Smoking status: Current Every Day Smoker -- 1.00 packs/day for 20 years    Types: Cigarettes  . Smokeless  tobacco: None     Comment: cut down from 1PAD to 5 cig daily since 11/12.Wants to talk about Chantix - back up to 1 ppd  . Alcohol Use: Yes     Comment: occasional beer    Review of Systems  Constitutional: Negative for fever.  Musculoskeletal: Positive for arthralgias.  Neurological: Positive for weakness and numbness.  All other systems reviewed and are negative.     Allergies  Prednisone  Home Medications   Prior to Admission medications   Medication Sig Start Date End Date Taking? Authorizing Provider  acetaminophen (TYLENOL) 500 MG tablet Take 1,500 mg by mouth every 6 (six) hours as needed for moderate pain.   Yes Historical Provider, MD  benazepril (LOTENSIN) 10 MG tablet Take 1 tablet (10 mg total) by mouth daily. Patient not taking: Reported on 09/02/2015 04/09/12   Belia HemanNeema K Sharda, MD  cyclobenzaprine (FLEXERIL) 5 MG tablet Take 1-2 tablets (5-10 mg total) by mouth 3 (three) times daily as needed for muscle spasms. Patient not taking: Reported on 09/02/2015 07/07/12   Belia HemanNeema K Sharda, MD  HYDROcodone-acetaminophen (NORCO) 5-325 MG per tablet Take 1 tablet by mouth every 6 (six) hours as needed for moderate pain. Patient not taking: Reported on 09/02/2015 04/07/14   Graylon GoodZachary H Baker, PA-C  meloxicam (MOBIC) 15 MG tablet Take 1 tablet (15 mg total) by mouth daily. Patient not taking: Reported on 09/02/2015  04/07/14   Graylon Good, PA-C  oxyCODONE-acetaminophen (PERCOCET/ROXICET) 5-325 MG tablet Take 2 tablets by mouth every 4 (four) hours as needed for severe pain. 09/02/15   Jeannetta Cerutti Patel-Mills, PA-C  PARoxetine (PAXIL) 20 MG tablet Take 1 tablet (20 mg total) by mouth daily. Patient not taking: Reported on 09/02/2015 04/09/12   Belia Heman, MD  rosuvastatin (CRESTOR) 10 MG tablet Take 1 tablet (10 mg total) by mouth at bedtime. Patient not taking: Reported on 09/02/2015 04/13/12 04/13/13  Belia Heman, MD  varenicline (CHANTIX) 0.5 MG tablet Days 1-3 take 0.5 mg (1 tab) once  daily; Days 4-7 take 0.5mg  (1 tab) twice daily; starting day 8, take  (2 tab) twice daily for 11 weeks. Patient not taking: Reported on 09/02/2015 04/09/12   Belia Heman, MD   BP 146/84 mmHg  Pulse 64  Temp(Src) 97.7 F (36.5 C) (Oral)  Resp 16  SpO2 98% Physical Exam  Constitutional: He is oriented to person, place, and time. He appears well-developed and well-nourished.  HENT:  Head: Normocephalic and atraumatic.  Eyes: Conjunctivae are normal.  Neck: Normal range of motion. Neck supple.  Cardiovascular: Normal rate.   Pulmonary/Chest: Effort normal. No respiratory distress.  Musculoskeletal: Normal range of motion.  Ambulatory with steady gait. No leg shortening. No reproducible hip pain. Able to dorsi and plantar flex without difficulty. Negative straight leg raise. No knee pain. No swelling of the leg. No rash.  Neurological: He is alert and oriented to person, place, and time.  Skin: Skin is warm and dry.  Nursing note and vitals reviewed.   ED Course  Procedures (including critical care time) Labs Review Labs Reviewed  CBC WITH DIFFERENTIAL/PLATELET  BASIC METABOLIC PANEL    Imaging Review Dg Hip Unilat With Pelvis 2-3 Views Left  09/02/2015  CLINICAL DATA:  49 year old male with new onset left hip pain and left leg numbness for the past 2 days. No known trauma EXAM: DG HIP (WITH OR WITHOUT PELVIS) 2-3V LEFT COMPARISON:  None. FINDINGS: There is no evidence of hip fracture or dislocation. There is no evidence of arthropathy or other focal bone abnormality. IMPRESSION: Negative. Electronically Signed   By: Malachy Moan M.D.   On: 09/02/2015 11:45   I have personally reviewed and evaluated these image and lab results as part of my medical decision-making.   EKG Interpretation None      MDM   Final diagnoses:  Meralgia paraesthetica, left   Patient presents for left hip pain, numbness, and weakness in the leg. No signs of DVT. Xray of the left hip and  pelvis is negative for acute dislocation, arthritis, or fracture. Labs are unremarkable.  This is most likely a meralgia paresthetica. I discussed taking tylenol for pain and percocet for breakthrough pain. I discussed return precautions with the patient as well as follow up and he verbally agrees with the plan.  Medications  naproxen (NAPROSYN) tablet 500 mg (500 mg Oral Given 09/02/15 1218)   Filed Vitals:   09/02/15 1044 09/02/15 1300  BP: 170/89 146/84  Pulse: 78 64  Temp: 97.7 F (36.5 C) 97.7 F (36.5 C)  Resp: 16 [address] PA-C 09/02/15 1439  Courteney Lyn Mackuen, MD 09/02/15 1510

## 2015-09-02 NOTE — Discharge Instructions (Signed)
Pinched Nerve Follow up with [facility] or use the resource guide below to find a primary care provider. Take Tylenol or Motrin for pain and Percocet for breakthrough pain. A pinched nerve is a type of injury that occurs when too much pressure is placed on a nerve. This pressure can cause pain, burning, and muscle weakness in places such as your arm, hand, back, leg, or neck. A nerve can become permanently damaged if it is severely pinched or if it has been pinched for a long time. CAUSES This condition may be caused by:  The passing of a nerve through a narrow area between bones or other body structures.  Loss of blood supply to a nerve.  A nerve being stretched from an injury.  A sudden injury with swelling.  Wear and tear that occurs over several years.  Changes that occur in the spine with age. SYMPTOMS The most common symptom of a pinched nerve is a tingling feeling or numbness. Other symptoms include:  Pain that radiates from the affected nerve to the body part that the nerve supplies.  A burning feeling.  Muscle weakness in the muscles supplied by the injured nerve. DIAGNOSIS This condition is diagnosed with a physical exam. During the exam, a health care provider will check for numbness and muscle weakness and move the affected body parts to test for pain. You may also have other tests, such as:  X-rays to check for bone damage.  An MRI or CT scan to check for nerve damage.  Electromyography (EMG) to check for electrical signals passing through nerves to muscles. TREATMENT The first treatment for a pinched nerve is usually rest and supportive devices, such as a splint, brace, or neck collar. Additional treatment depends on symptoms and the amount of nerve damage. This can include:  Medicines, such as:  Numbing medicine injections.  Nonsteroidal anti-inflammatory drugs (NSAIDs).  Steroid medicines in pill form or by injection.  Physical therapy to  relieve pain, maintain movement, and improve muscle strength.  Surgery. This may be done if other treatments do not work. HOME CARE INSTRUCTIONS  Only take medicines as directed by your health care provider.  Wear supportive or protective devices as directed by your health care provider.  Do stretching and strengthening exercises at home as directed by your health care provider.  Rest as needed.  Keep all follow-up visits as directed by your health care provider. This is important. SEEK MEDICAL CARE IF:  Your condition does not improve with treatment.  Your pain, numbness, or weakness suddenly gets worse.   This information is not intended to replace advice given to you by your health care provider. Make sure you discuss any questions you have with your health care provider.   Document Released: 08/16/2002 Document Revised: 09/16/2014 Document Reviewed: 06/01/2014 Elsevier Interactive Patient Education 2016 [employer].  Emergency Department Resource Guide 1) Find a Doctor and Pay Out of Pocket Although you won't have to find out who is covered by your insurance plan, it is a good idea to ask around and get recommendations. You will then need to call the office and see if the doctor you have chosen will accept you as a new patient and what types of options they offer for patients who are self-pay. Some doctors offer discounts or will set up payment plans for their patients who do not have insurance, but you will need to ask so you aren't surprised when you get to your appointment.  2)  Contact Your Local Health Department Not all health departments have doctors that can see patients for sick visits, but many do, so it is worth a call to see if yours does. If you don't know where your local health department is, you can check in your phone book. The CDC also has a tool to help you locate your state's health department, and many state websites also have listings of all of their local  health departments.  3) Find a Walk-in Clinic If your illness is not likely to be very severe or complicated, you may want to try a walk in clinic. These are popping up all over the country in pharmacies, drugstores, and shopping centers. They're usually staffed by nurse practitioners or physician assistants that have been trained to treat common illnesses and complaints. They're usually fairly quick and inexpensive. However, if you have serious medical issues or chronic medical problems, these are probably not your best option.  No Primary Care Doctor: - Call Health Connect at  [phone] - they can help you locate a primary care doctor that  accepts your insurance, provides certain services, etc. - Physician Referral Service- [phone]  Chronic Pain Problems: Organization         Address  Phone   Notes  [facility]  [phone] Patients need to be referred by their primary care doctor.   Medication Assistance: Organization         Address  Phone   Notes  [facility] Medication [facility] [address] [place], [place] [address] [phone]) [phone] --Must be a resident of [facility] [place] -- Must have NO insurance coverage whatsoever (no Medicaid/ Medicare, etc.) -- The pt. MUST have a primary care doctor that directs their care regularly and follows them in the community   MedAssist  [phone]   [employer]  [phone]    Agencies that provide inexpensive medical care: Organization         Address  Phone   Notes  [facility]  [phone]   [facility]    [phone]   [facility] [address] [place], [place] [address] [phone]   [facility] [address], [place] [phone]) [phone]   Planned Parenthood    [phone]) [phone]   [facility]    [phone]   [facility] Health and [facility]  [address], [place] Phone:   [phone], Fax:  [phone]) [phone] Hours of Operation:  9 am - 6 pm, M-F.  Also accepts Medicaid/Medicare and self-pay.  [facility]  [address], [place] Phone: [phone], Fax: [phone]. Hours of Operation:  8:30 am - 5:30 pm, M-F.  Also accepts Medicaid and self-pay.  [facility] [place] [address], [place] Phone: [phone]) [phone]   [facility] [address], [place], [place] [phone], Ext. [phone] Mondays & Thursdays: 7-9 AM.  First 15 patients are seen on a first come, first serve basis.    Medicaid-accepting [facility] Providers:  Organization         Address  Phone   Notes  [facility] [address], [place] [phone] Also accepts self-pay patients.  [facility] [address], [place]  [phone]   [facility] [address],  [place] [phone]   [place] [address] [place] [phone]   Lucianne Lei [address], [place]   [phone] Only accepts [place] Access [place] patients after they have their name applied to their card.   Self-Pay (no insurance) in [facility]:  Organization         Address  Phone   Notes  Sickle Cell Patients, [facility] [place] [phone]   [facility] [place] [phone]   [facility] [place]  [place], [place], [place] [phone]   [facility]/Dr. Osei-Bonsu  [address] [place] or [address], [place] [phone] Phone number for both [place] and [place] locations is the same.  Urgent Medical and [facility] [address] [place] [phone]   [facility] [address], [place] or [address] [phone]   [facility] [address], [place] [phone], phone; [phone], fax Sees patients 1st and 3rd Saturday of every month.  Must not qualify for public or private insurance (i.e. Medicaid, Medicare, [place] Health Choice, Veterans' Benefits)  Household income should be no more than 200% of the poverty level The clinic cannot treat you if you are pregnant or think you are pregnant  Sexually transmitted diseases are not treated at the clinic.    Dental Care: Organization         Address  Phone  Notes  [facility] [place] [phone] Accepts children up to age 19 who are enrolled in [place] or [place]; pregnant women with a Medicaid card; and children who have applied for Medicaid or [place] Health Choice, but were declined, whose parents can pay a reduced fee at time of service.  [facility]  [address], [place] [phone] Accepts children up to age 55 who are enrolled in [place] or [place]; pregnant women with a Medicaid card; and children who have applied for Medicaid or [place] Health Choice, but were declined, whose parents can pay a reduced fee at time of service.  [place] Adult Dental Access PROGRAM  [place] [phone] Patients are seen by appointment only. Walk-ins are not accepted. [place] will see patients 10 years of age and older. Monday - Tuesday (8am-5pm) Most Wednesdays (8:30-5pm) $30 per visit, cash only  [facility] Adult Dental Access PROGRAM  [address], [facility] [phone] Patients are seen by appointment only. Walk-ins are not accepted. [place] will see patients 66 years of age and older. One Wednesday Evening (Monthly: Volunteer Based).  $30 per visit, cash only  [place]  [phone] for adults;  Children under age 26, call Graduate Pediatric Dentistry at [phone]. Children aged 84-14, please call [phone] to request a pediatric application.  Dental services are provided in all areas of dental care including fillings, crowns and bridges, complete and partial dentures, implants, gum treatment, root canals, and extractions. Preventive care is also provided. Treatment is provided to both adults and children. Patients are selected via a lottery and there is often a waiting list.   [facility] [address], [place]  [phone] [email or web address]   [facility] [address] [place], [place] [phone], Ext. [phone] Second and Fourth Thursday of each month, opens at 6:30 AM; Clinic ends at 9 AM.  Patients are seen on a first-come first-served basis, and a limited number are seen during each clinic.   [facility]  [address] Hillard Danker [place], [place] [phone]   Eligibility Requirements You must have lived in [place], [place], or [place] counties for at least the last three months.   You cannot be eligible for state or federal sponsored [employer], including [employer], [place], or [employer].   You generally cannot be eligible for healthcare insurance through your employer.    How to apply: Eligibility screenings are held every Tuesday and Wednesday afternoon from 1:00 pm until 4:00 pm. You do not need an appointment for the interview!  [facility] [address], [place], [employer]   [place]  [place] Department  [place]  [phone]    Behavioral Health Resources in the Community: Intensive Outpatient Programs Organization         Address  Phone  Notes  [employer] [place]. [address], [place], [place] [phone]   [facility] [address] [place], [place]   ADS: [facility] [address] [place], [place]   [place] [address],  [place], [place] or [phone]   Substance Abuse Resources Organization         Address  Phone  Notes  Alcohol and Drug Services  [phone]   [place]  [phone]   [facility] [place]   Chinita Pester  [phone]   Residential & Outpatient Substance Abuse Program  [phone]   Psychological Services Organization         Address  Phone  Notes  [facility] [place]  [place]  [phone]   [place] [address], [place] or [phone]    Mobile Crisis Teams Organization         Address  Phone  Notes  [facility], Mobile Crisis Care Unit  [phone]   [facility]  [address] [place], [place]   [place] Levels [address], [place] [phone]    Self-Help/Support Groups Organization         Address  Phone             Notes  [place]. of [place] - variety of support groups  [place] Call for more information  Narcotics Anonymous (NA), [facility] [address], [employer] [place]  2 meetings at this location   Special educational needs teacher         Address  Phone  Notes  [facility] [place],    [place]  [phone]   [facility]  [address], [place] 989211, [place], [place]   [place], [place] [phone] Admissions: 8am-3pm M-F  Incentives Substance [place] [address],    [place], [place] [phone]   [facility] [address] Jadene Pierini [place], [place]   [facility] [address],  [place], [place]   [facility] Programs -  Intensive  Outpatient [address], [place], [place] [phone]   [facility] [address]  [place], [place] [phone] or [phone]   [facility] (RTS) [address], [place], [place] [phone] Accepts Medicaid  [facility] [address]  [place] [phone] Substance Abuse/Addiction Treatment   [facility] Organization         Address  Phone  Notes  [facility]  [phone]   Angie Fava, PhD [address] Ervin Knack [place], [place]   [phone] or [phone]   [facility]   [address] [place], [place] [phone]   [facility] [address], [place], [place] [phone] Insurance/Medicaid/sponsorship through [facility] and Families [address]                                    [place], [place] [phone] Therapy/tele-psych/case  [facility] [address], [place] [phone]    Dr. Lolly Mustache  [phone]   [facility] [place]  United [employer] [facility] Dept. 1) [address], [place] 2) [address] [place] 3)  [address], [place] [phone]  [phone]   [facility] Child Abuse Hotline 9407080674 or [phone] (After Hours)

## 2015-09-02 NOTE — ED Notes (Signed)
He c/o left hip area pain plus left-sided meralgia x 2 days.  He denies any recent trauma and cites much ambulation and the climbing of steps and ladders at his workplace.

## 2016-07-10 ENCOUNTER — Ambulatory Visit: Payer: Self-pay

## 2016-07-10 ENCOUNTER — Other Ambulatory Visit: Payer: Self-pay | Admitting: Occupational Medicine

## 2016-07-10 DIAGNOSIS — Z0000 Encounter for general adult medical examination without abnormal findings: Secondary | ICD-10-CM

## 2016-10-22 ENCOUNTER — Encounter (HOSPITAL_COMMUNITY): Payer: Self-pay | Admitting: Emergency Medicine

## 2016-10-22 ENCOUNTER — Ambulatory Visit (HOSPITAL_COMMUNITY)
Admission: EM | Admit: 2016-10-22 | Discharge: 2016-10-22 | Disposition: A | Discharge status: Home / Self Care | Payer: Worker's Compensation | Attending: Family Medicine | Admitting: Family Medicine

## 2016-10-22 DIAGNOSIS — B9789 Other viral agents as the cause of diseases classified elsewhere: Secondary | ICD-10-CM | POA: Diagnosis not present

## 2016-10-22 DIAGNOSIS — J069 Acute upper respiratory infection, unspecified: Secondary | ICD-10-CM | POA: Diagnosis not present

## 2016-10-22 DIAGNOSIS — M779 Enthesopathy, unspecified: Secondary | ICD-10-CM | POA: Diagnosis not present

## 2016-10-22 MED ORDER — MELOXICAM 15 MG PO TABS: 15.0000 mg | ORAL_TABLET | Freq: Every day | ORAL | 1 refills | Status: DC

## 2016-10-22 NOTE — ED Triage Notes (Signed)
Pt. Stated, I think I twisted my right wrist, It really hurts.  I also think I have developed the crude. I have a scratchy throat and running a fever.

## 2016-10-22 NOTE — ED Provider Notes (Signed)
CSN: [id number]     Arrival date & time 10/22/16  1732 History   First MD Initiated Contact with Patient 10/22/16 1821     Chief Complaint  Patient presents with  . Wrist Pain  . Sore Throat  . Fever   (Consider location/radiation/quality/duration/timing/severity/associated sxs/prior Treatment) 51 year old male presents to clinic with 2 day history of wrist pain. Denies trauma, has not fallen, has not had recent injury. 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 States he has numbness and tingling, difficulty forming grip.   The history is provided by the patient.  Wrist Pain   Sore Throat   Fever    Past Medical History:  Diagnosis Date  . Amblyopia    right eye- surgery done during childhood  . Dyslipidemia   . Erectile dysfunction   . Hypertension   . Sleep apnoea, obstructed    mild   Past Surgical History:  Procedure Laterality Date  . MULTIPLE TOOTH EXTRACTIONS  07/2011   all teeth extracted and dentures placed   Family History  Problem Relation Age of Onset  . Diabetes Mother   . Hypertension Mother   . Cancer Mother     colon cancer dx 3 years ago  . Coronary artery disease Father   . Hypertension Father   . Valvular heart disease Father     h/o tricuspid valve replacement surgery  . Coronary artery disease Sister     age of onset <60 in 73st degree male relatives   Social History  Substance Use Topics  . Smoking status: Current Every Day Smoker    Packs/day: 1.00    Years: 20.00    Types: Cigarettes  . Smokeless tobacco: Current User     Comment: cut down from 1PAD to 5 cig daily since 11/12.Wants to talk about Chantix - back up to 1 ppd  . Alcohol use Yes     Comment: occasional beer    Review of Systems  Reason unable to perform ROS: as covered in HPI.  Constitutional: Positive for fever.  All other systems reviewed and are negative.   Allergies  Prednisone  Home Medications   Prior to Admission medications    Medication Sig Start Date End Date Taking? Authorizing Provider  acetaminophen (TYLENOL) 500 MG tablet Take 1,500 mg by mouth every 6 (six) hours as needed for moderate pain.    Historical Provider, MD  benazepril (LOTENSIN) 10 MG tablet Take 1 tablet (10 mg total) by mouth daily. Patient not taking: Reported on 09/02/2015 04/09/12   Belia Heman, MD  cyclobenzaprine (FLEXERIL) 5 MG tablet Take 1-2 tablets (5-10 mg total) by mouth 3 (three) times daily as needed for muscle spasms. Patient not taking: Reported on 09/02/2015 07/07/12   Belia Heman, MD  HYDROcodone-acetaminophen (NORCO) 5-325 MG per tablet Take 1 tablet by mouth every 6 (six) hours as needed for moderate pain. Patient not taking: Reported on 09/02/2015 04/07/14   Graylon Good, PA-C  meloxicam (MOBIC) 15 MG tablet Take 1 tablet (15 mg total) by mouth daily. 10/22/16   Dorena Bodo, NP  oxyCODONE-acetaminophen (PERCOCET/ROXICET) 5-325 MG tablet Take 2 tablets by mouth every 4 (four) hours as needed for severe pain. 09/02/15   Hanna Patel-Mills, PA-C  PARoxetine (PAXIL) 20 MG tablet Take 1 tablet (20 mg total) by mouth daily. Patient not taking: Reported on 09/02/2015 04/09/12   Belia Heman, MD  rosuvastatin (CRESTOR) 10 MG tablet Take 1 tablet (10 mg  total) by mouth at bedtime. Patient not taking: Reported on 09/02/2015 04/13/12 04/13/13  Belia HemanNeema K Sharda, MD  varenicline (CHANTIX) 0.5 MG tablet Days 1-3 take 0.5 mg (1 tab) once daily; Days 4-7 take 0.5mg  (1 tab) twice daily; starting day 8, take 1mg  (2 tab) twice daily for 11 weeks. Patient not taking: Reported on 09/02/2015 04/09/12   Belia HemanNeema K Sharda, MD   Meds Ordered and Administered this Visit  Medications - No data to display  BP 139/76 (BP Location: Right Arm)   Pulse 96   Temp 99.1 F (37.3 C) (Oral)   Resp 17   Ht 5\' 9"  (1.753 m)   Wt 195 lb (88.5 kg)   SpO2 97%   BMI 28.80 kg/m  No data found.   Physical Exam  Constitutional: He is oriented to person, place,  and time. He appears well-developed and well-nourished. No distress.  HENT:  Head: Normocephalic and atraumatic.  Right Ear: Tympanic membrane and external ear normal.  Left Ear: Tympanic membrane and external ear normal.  Nose: Nose normal. Right sinus exhibits no maxillary sinus tenderness and no frontal sinus tenderness. Left sinus exhibits no maxillary sinus tenderness and no frontal sinus tenderness.  Mouth/Throat: Uvula is midline and oropharynx is clear and moist. No oropharyngeal exudate.  Eyes: Pupils are equal, round, and reactive to light.  Neck: Normal range of motion. Neck supple. No JVD present.  Cardiovascular: Normal rate and regular rhythm.   Pulmonary/Chest: Effort normal and breath sounds normal. No respiratory distress. He has no wheezes.  Abdominal: Soft. Bowel sounds are normal.  Musculoskeletal:       Right wrist: He exhibits tenderness. He exhibits normal range of motion, no swelling, no effusion, no crepitus, no deformity and no laceration.  Tenderness to percussion of the tendon, negative Finkelstein, pain with flexion.   Lymphadenopathy:    He has no cervical adenopathy.  Neurological: He is alert and oriented to person, place, and time.  Skin: Skin is warm and dry. Capillary refill takes less than 2 seconds. No rash noted. He is not diaphoretic. No erythema.  Psychiatric: He has a normal mood and affect.  Nursing note and vitals reviewed.   Urgent Care Course     Procedures (including critical care time)  Labs Review Labs Reviewed - No data to display  Imaging Review No results found.   Visual Acuity Review  Right Eye Distance:   Left Eye Distance:   Bilateral Distance:    Right Eye Near:   Left Eye Near:    Bilateral Near:         MDM   1. Tendonitis   2. Viral upper respiratory tract infection   Based on your symptoms and physical exam, you most likely havr tendonitis. I advise rest, ice, elevate the affected extremity, wrap with ace  bandages or appropriately sized split for support and compression. For ice, apply 15 minutes at a time up to 4 times daily, you may also alternate with heat if you wish. For medication, I have prescribed an antiinflammatory called Mobic, take 1 tablet daily.   You most likely have a viral URI, I advise rest, plenty of fluids and management of symptoms with over the counter medicines. For symptoms you may take Tylenol as needed every 4-6 hours for body aches or fever, not to exceed 4,000 mg a day, Take mucinex or mucinex DM ever 12 hours with a full glass of water, you may use an inhaled steroid such as Flonase, 2  sprays each nostril once a day for congestion, or an antihistamine such as Claritin or Zyrtec once a day. Should your symptoms worsen or fail to resolve, follow up with your primary care provider or return to clinic.      Dorena Bodo, NP 10/22/16 1840

## 2016-10-22 NOTE — Discharge Instructions (Signed)
Based on your symptoms and physical exam, you most likely havr tendonitis. I advise rest, ice, elevate the affected extremity, wrap with ace bandages or appropriately sized split for support and compression. For ice, apply 15 minutes at a time up to 4 times daily, you may also alternate with heat if you wish. For medication, I have prescribed an antiinflammatory called Mobic, take 1 tablet daily.   You most likely have a viral URI, I advise rest, plenty of fluids and management of symptoms with over the counter medicines. For symptoms you may take Tylenol as needed every 4-6 hours for body aches or fever, not to exceed 4,000 mg a day, Take mucinex or mucinex DM ever 12 hours with a full glass of water, you may use an inhaled steroid such as Flonase, 2 sprays each nostril once a day for congestion, or an antihistamine such as Claritin or Zyrtec once a day. Should your symptoms worsen or fail to resolve, follow up with your primary care provider or return to clinic.

## 2020-08-31 ENCOUNTER — Encounter: Payer: Self-pay | Admitting: Emergency Medicine

## 2020-08-31 ENCOUNTER — Ambulatory Visit
Admission: EM | Admit: 2020-08-31 | Discharge: 2020-08-31 | Disposition: A | Discharge status: Home / Self Care | Payer: Self-pay | Attending: Family Medicine | Admitting: Family Medicine

## 2020-08-31 ENCOUNTER — Ambulatory Visit (INDEPENDENT_AMBULATORY_CARE_PROVIDER_SITE_OTHER): Payer: Self-pay

## 2020-08-31 ENCOUNTER — Other Ambulatory Visit: Payer: Self-pay

## 2020-08-31 DIAGNOSIS — M25461 Effusion, right knee: Secondary | ICD-10-CM

## 2020-08-31 DIAGNOSIS — M1990 Unspecified osteoarthritis, unspecified site: Secondary | ICD-10-CM

## 2020-08-31 MED ORDER — MELOXICAM 15 MG PO TABS: 15.0000 mg | ORAL_TABLET | Freq: Every day | ORAL | 1 refills | Status: DC

## 2020-08-31 NOTE — ED Provider Notes (Signed)
Isaiah Gomez    CSN: [id number] Arrival date & time: 08/31/20  1208      History   Chief Complaint Chief Complaint  Patient presents with   Knee Pain    HPI Isaiah Gomez is a 54 y.o. male.   Patient is a 54 year old male presents today with right knee pain, swelling.  Is been present for the past day.  Denies any fall or trauma.  Pain worse with flexion of the knee.  Has taken ibuprofen, rest, ice and elevation without any relief.  Denies any history of similar symptoms.  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 Authorizing Provider  acetaminophen (TYLENOL) 500 MG tablet Take 1,500 mg by mouth every 6 (six) hours as needed for moderate pain.    [provider]  meloxicam (MOBIC) 15 MG tablet Take 1 tablet (15 mg total) by mouth daily. 08/31/20   Chika Cichowski, Gloris Manchester A, NP  benazepril (LOTENSIN) 10 MG tablet Take 1 tablet (10 mg total) by mouth daily. Patient not taking: No sig reported 04/09/12 08/31/20  Belia Heman, MD  PARoxetine (PAXIL) 20 MG tablet Take 1 tablet (20 mg total) by mouth daily. Patient not taking: No sig reported 04/09/12 08/31/20  Belia Heman, MD  rosuvastatin (CRESTOR) 10 MG tablet Take 1 tablet (10 mg total) by mouth at bedtime. Patient  not taking: Reported on 09/02/2015 04/13/12 08/31/20  Belia Heman, MD    Family History Family History  Problem Relation Age of Onset   Diabetes Mother    Hypertension Mother    Cancer Mother        colon cancer dx 3 years ago   Coronary artery disease Father    Hypertension Father    Valvular heart disease Father        h/o tricuspid valve replacement surgery   Coronary artery disease Sister        age of onset <60 in 77st degree male relatives    Social History Social History   Tobacco Use   Smoking status: Current Every Day Smoker    Packs/day: 1.00    Years: 20.00    Pack years: 20.00    Types: Cigarettes   Smokeless tobacco: Current User   Tobacco comment: cut down from 1PAD to 5 cig daily since 11/12.Wants to talk about Chantix - back up to 1 ppd  Substance Use Topics   Alcohol use: Yes    Comment: occasional beer   Drug use: No     Allergies   Prednisone   Review of Systems Review of Systems   Physical Exam Triage Vital Signs ED Triage Vitals  Enc Vitals Group  BP 08/31/20 1218 (!) 164/99     Pulse Rate 08/31/20 1218 81     Resp 08/31/20 1218 13     Temp 08/31/20 1218 98.3 F (36.8 C)     Temp Source 08/31/20 1218 Oral     SpO2 08/31/20 1218 96 %     Weight 08/31/20 1216 200 lb (90.7 kg)     Height 08/31/20 1216 5\' 9"  (1.753 m)     Head Circumference --      Peak Flow --      Pain Score 08/31/20 1216 4     Pain Loc --      Pain Edu? --      Excl. in GC? --    No data found.  Updated Vital Signs BP (!) 164/99 (BP Location: Left Arm)    Pulse 81    Temp 98.3 F (36.8 C) (Oral)    Resp 13    Ht 5\' 9"  (1.753 m)    Wt 200 lb (90.7 kg)    SpO2 96%    BMI 29.53 kg/m   Visual Acuity Right Eye Distance:   Left Eye Distance:   Bilateral Distance:    Right Eye Near:   Left Eye Near:    Bilateral Near:     Physical Exam Vitals and nursing note reviewed.  Constitutional:      Appearance: Normal appearance.  HENT:      Head: Normocephalic and atraumatic.     Nose: Nose normal.  Eyes:     Conjunctiva/sclera: Conjunctivae normal.  Pulmonary:     Effort: Pulmonary effort is normal.  Musculoskeletal:     Cervical back: Normal range of motion.     Right knee: Swelling and effusion present. Decreased range of motion. Tenderness present.  Skin:    General: Skin is warm and dry.  Neurological:     Mental Status: He is alert.  Psychiatric:        Mood and Affect: Mood normal.      UC Treatments / Results  Labs (all labs ordered are listed, but only abnormal results are displayed) Labs Reviewed - No data to display  EKG   Radiology DG Knee Complete 4 Views Right  Result Date: 08/31/2020 CLINICAL DATA:  Right knee pain EXAM: RIGHT KNEE - COMPLETE 4+ VIEW COMPARISON:  None. FINDINGS: Small joint effusion is noted. No acute fracture or dislocation is noted. Mild degenerative changes are noted particularly in the patellofemoral space. IMPRESSION: Mild degenerative change with small joint effusion. No acute fracture is noted. Electronically Signed   By: M.D.   On: 08/31/2020 12:50    Procedures Procedures (including critical care time)  Medications Ordered in UC Medications - No data to display  Initial Impression / Assessment and Plan / UC Course  I have reviewed the triage vital signs and the nursing notes.  Pertinent labs & imaging results that were available during my care of the patient were reviewed by me and considered in my medical decision making (see chart for details).     Right knee effusion from arthritis No other concerns on x-ray. Will prescribe meloxicam daily for pain, swelling and inflammation.  Patient has a knee sleeve that he purchased that he will continue to wear.  Rest, ice, elevate. Follow up as needed for continued or worsening symptoms  Final Clinical Impressions(s) / UC Diagnoses   Final diagnoses:  Knee effusion, right  Arthritis     Discharge  Instructions  Take the meloxicam daily for pain, swelling and inflammation. Rest, ice, elevate the knee. He can do an Ace wrap to the knee or buy a knee sleeve over-the-counter Follow up as needed for continued or worsening symptoms     ED Prescriptions    Medication Sig Dispense Auth. Provider   meloxicam (MOBIC) 15 MG tablet Take 1 tablet (15 mg total) by mouth daily. 15 tablet Jashanti Clinkscale A, NP     PDMP not reviewed this encounter.   Janace Aris, NP 08/31/20 1309

## 2020-08-31 NOTE — ED Triage Notes (Signed)
Patient c/o RT knee pain x 1 day.   Patient denies any fall or trauma.   Patient endorses increased pain with bending knee.   Patient endorses swelling.   Patient has taken Ibuprofen, Rest, Ice, and Elevation w/ no relief of symptoms.

## 2020-08-31 NOTE — Discharge Instructions (Signed)
Take the meloxicam daily for pain, swelling and inflammation. Rest, ice, elevate the knee. He can do an Ace wrap to the knee or buy a knee sleeve over-the-counter Follow up as needed for continued or worsening symptoms

## 2022-10-17 IMAGING — DX DG KNEE COMPLETE 4+V*R*
4 series · 4 of 4 positions shown · non-contrast
Comparison: None.

CLINICAL DATA: Right knee pain

EXAM:
RIGHT KNEE - COMPLETE 4+ VIEW

[knee ap]
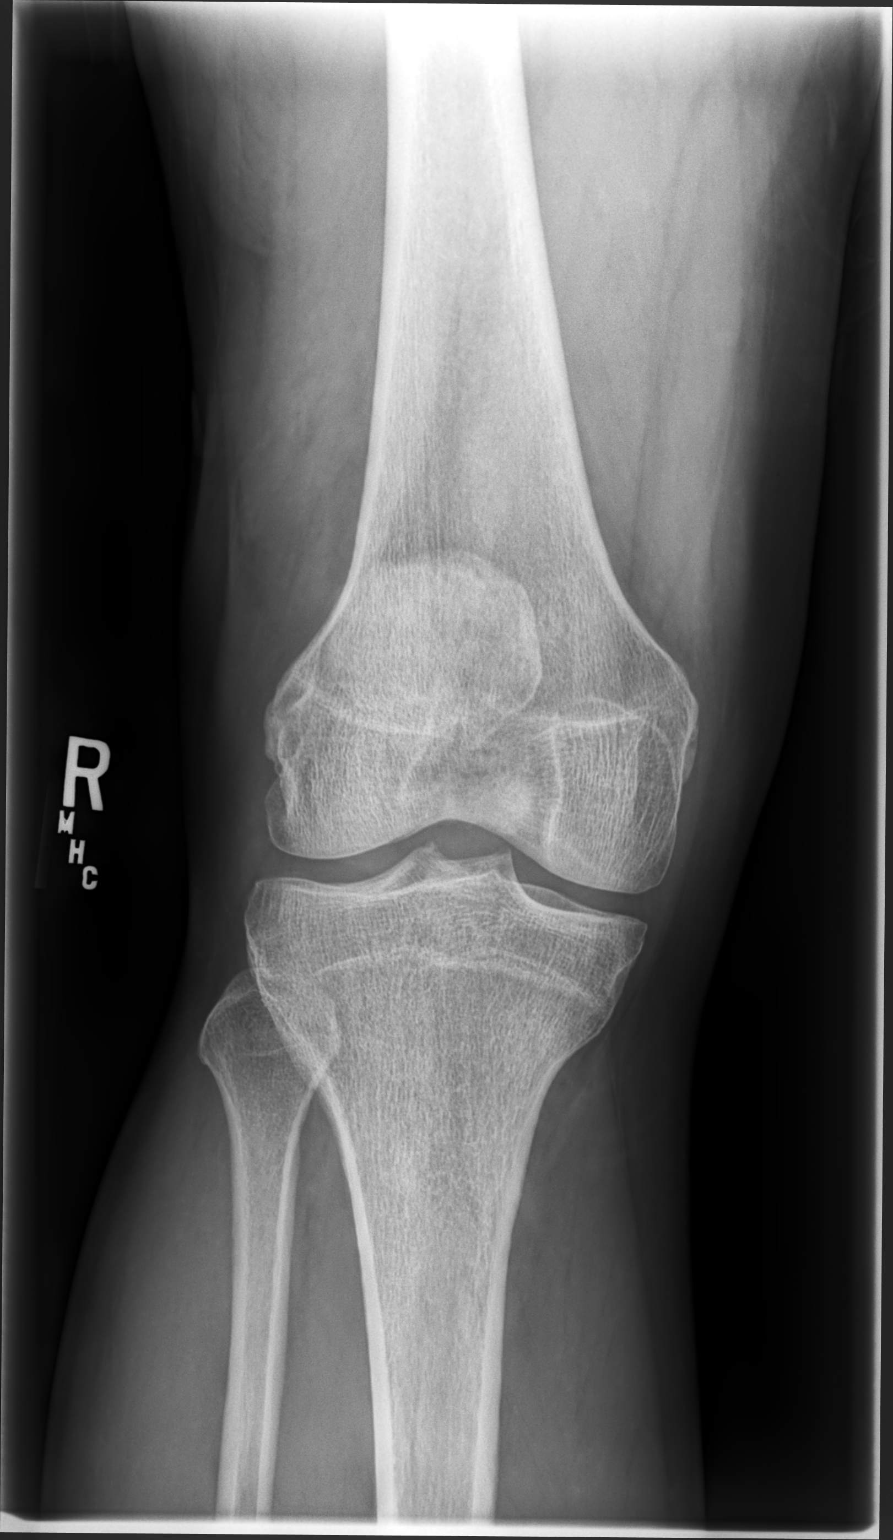

[knee mlo]
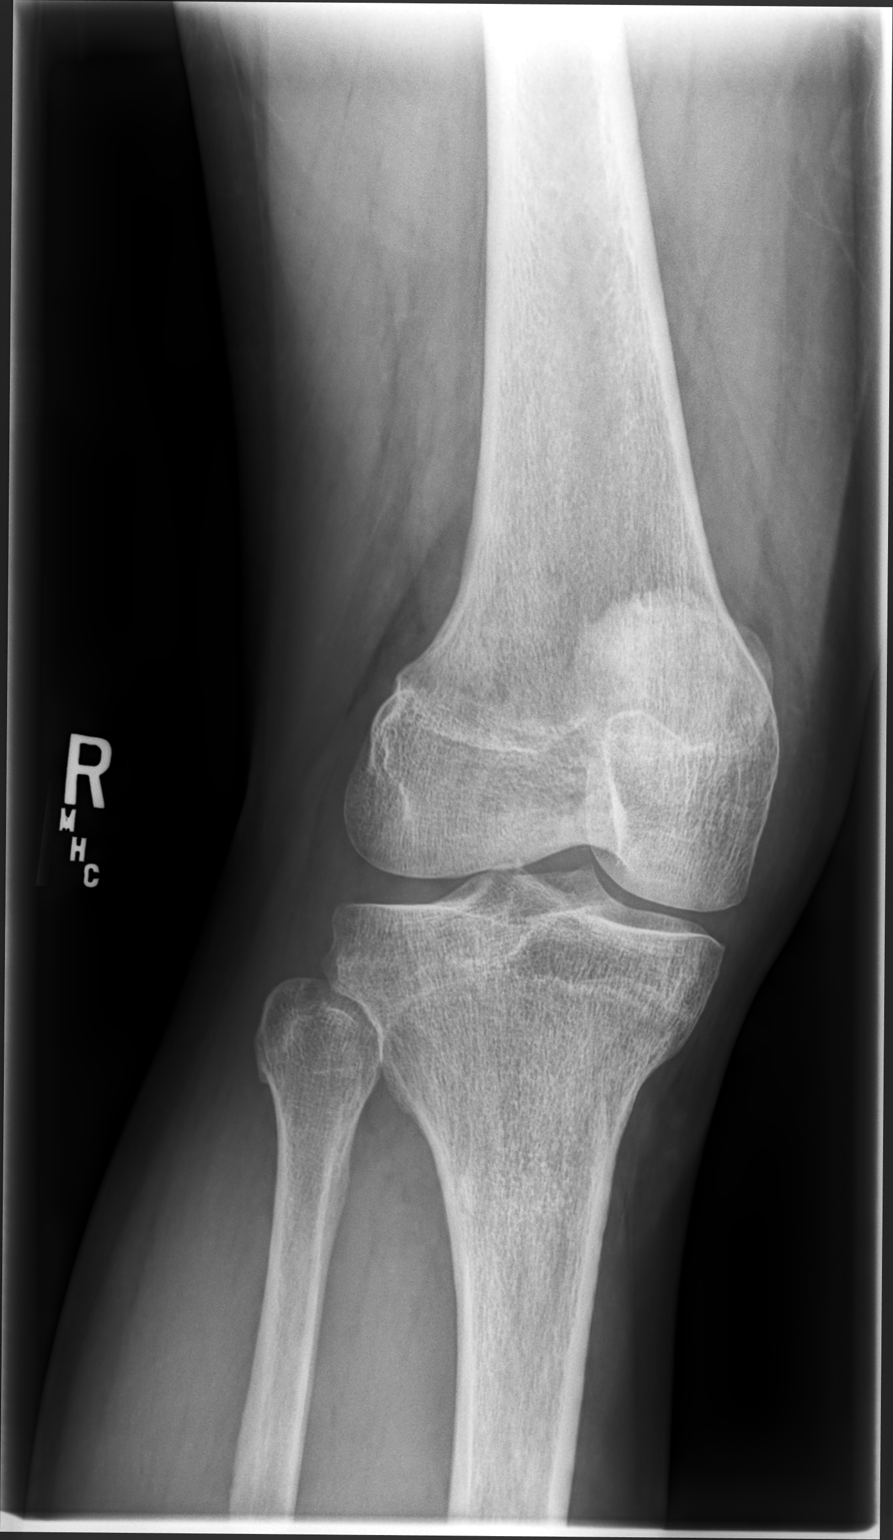

[knee lmo]
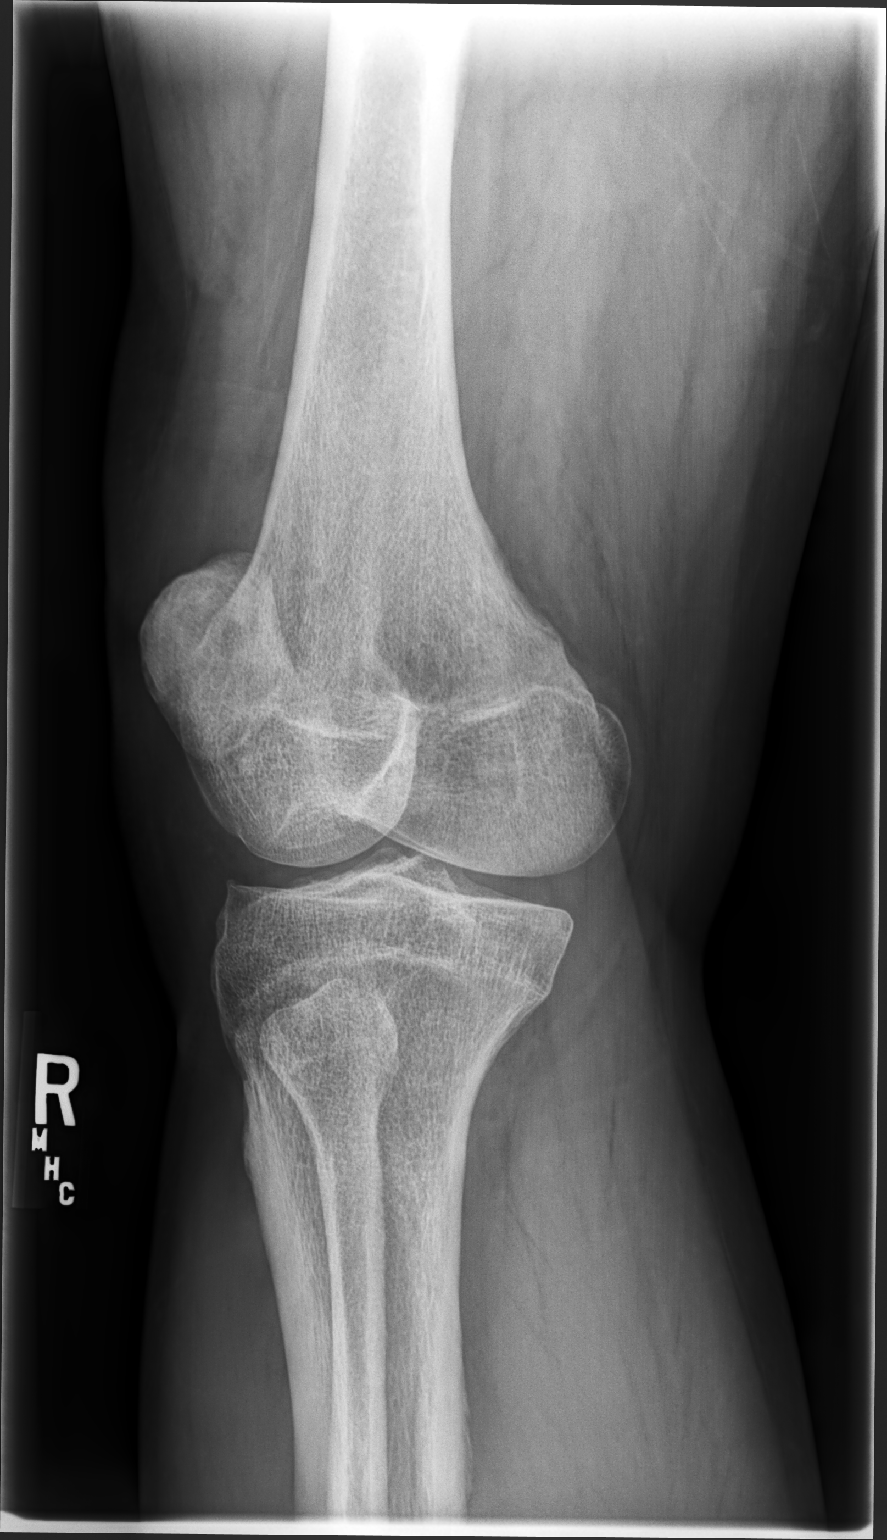

[knee lat]
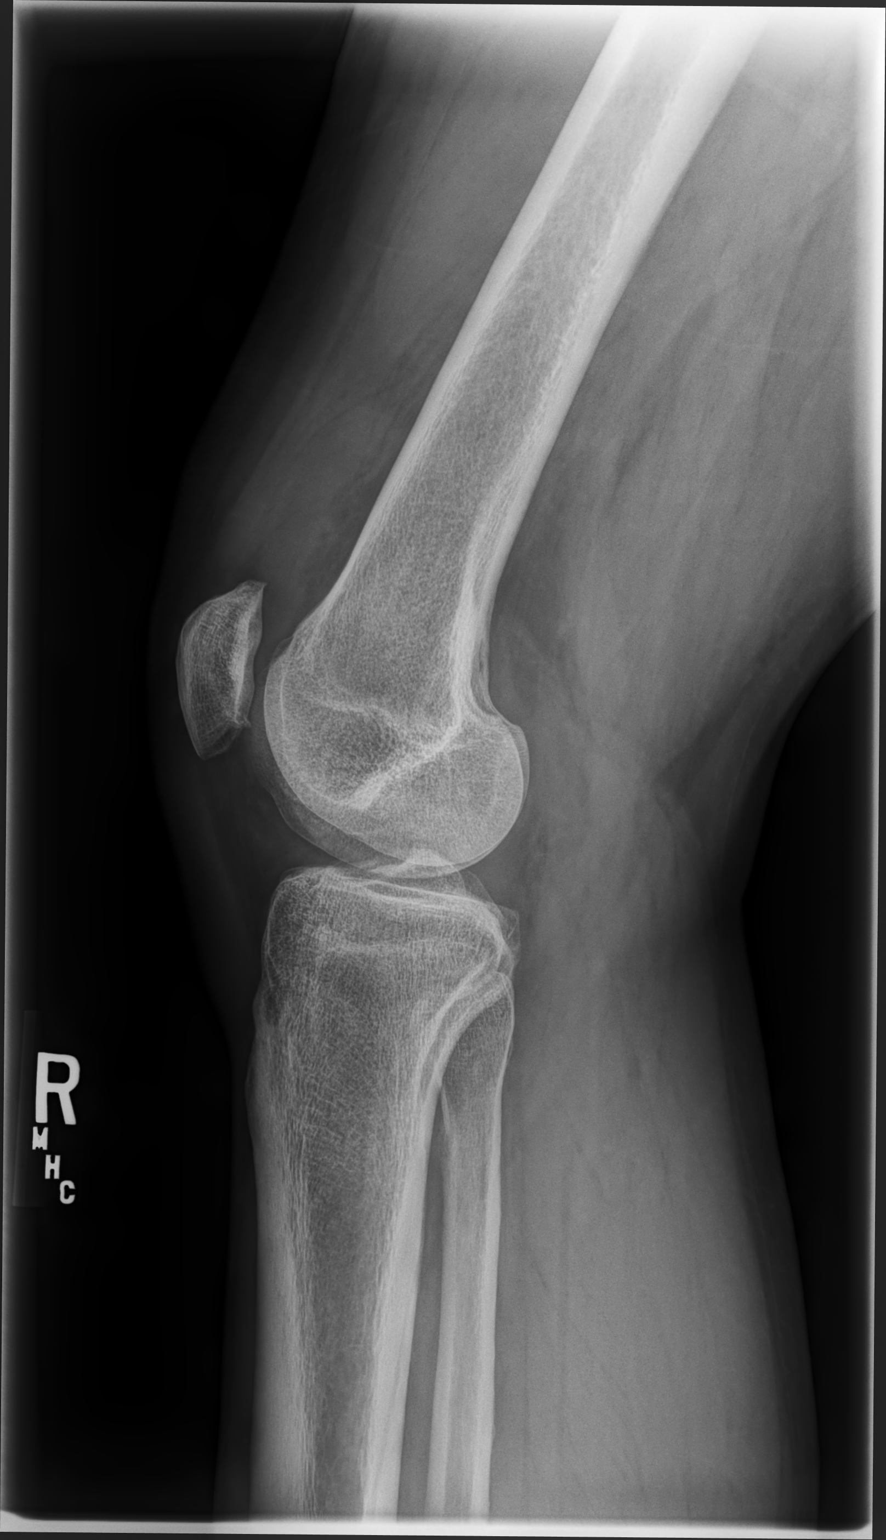

[4 of 4 positions shown; findings below may reference images not displayed]

FINDINGS: Small joint effusion is noted. No acute fracture or dislocation is
noted. Mild degenerative changes are noted particularly in the
patellofemoral space.
IMPRESSION: Mild degenerative change with small joint effusion. No acute
fracture is noted.

## 2024-06-28 ENCOUNTER — Encounter: Payer: Self-pay | Admitting: Student

## 2024-06-28 ENCOUNTER — Other Ambulatory Visit: Payer: Self-pay

## 2024-06-28 ENCOUNTER — Ambulatory Visit (INDEPENDENT_AMBULATORY_CARE_PROVIDER_SITE_OTHER): Payer: Self-pay | Admitting: Student

## 2024-06-28 VITALS — BP 197/98 | HR 81 | Ht 69.0 in | Wt 228.8 lb

## 2024-06-28 DIAGNOSIS — G2581 Restless legs syndrome: Secondary | ICD-10-CM | POA: Insufficient documentation

## 2024-06-28 DIAGNOSIS — F17210 Nicotine dependence, cigarettes, uncomplicated: Secondary | ICD-10-CM

## 2024-06-28 DIAGNOSIS — Z87891 Personal history of nicotine dependence: Secondary | ICD-10-CM

## 2024-06-28 DIAGNOSIS — I10 Essential (primary) hypertension: Secondary | ICD-10-CM | POA: Diagnosis not present

## 2024-06-28 DIAGNOSIS — Z8249 Family history of ischemic heart disease and other diseases of the circulatory system: Secondary | ICD-10-CM | POA: Diagnosis not present

## 2024-06-28 MED ORDER — LOSARTAN POTASSIUM-HCTZ 50-12.5 MG PO TABS
1.0000 | ORAL_TABLET | Freq: Every day | ORAL | 11 refills | Status: DC
  Filled 2024-06-28: qty 30, 30d supply, fill #0

## 2024-06-28 NOTE — Assessment & Plan Note (Signed)
 Uncontrolled hypertension, BP elevated on both initial and repeat checks; patient asymptomatic and not on antihypertensives. - Start losartan-HCTZ 50-12.5 mg combination pill  due to significant elevation. - will check A1c and lipid panel to screen for diabetes and hyperlipidemia; obtain baseline for potential statin therapy. - Encourage home BP monitoring. - Follow-up in 2 weeks for BP and lab review.  UPDATE:  Elevated LDL, ASCVD risk >26%, started on atorvastatin 40 mg daily.

## 2024-06-28 NOTE — Assessment & Plan Note (Signed)
 Chronic symptoms >5 years, not on medication. No clear aggravating factors identified. Plan: - Will check serum iron, ferritin, TIBC. - If ferritin is low, replete with oral or IV iron as appropriate. - Consider gabapentin initiation if ferritin is normal. Update:  Normal ferritin, will start gabapentin 100 mg for the RLS.

## 2024-06-28 NOTE — Patient Instructions (Addendum)
 It was a pleasure taking care of you today!    For your elevated blood pressure, we will start you on losartan-HCTZ 50mg /12.5mg .  Take 1 tablet daily.  2.  I will call you with the results of your labs.   I have ordered the following labs for you:   Lab Orders         Comprehensive metabolic panel with GFR         CBC no Diff         Lipid Profile         Hemoglobin A1c         Ferritin         Iron and IBC ([id number])       Follow up: 2 weeks follow with me   Should you have any questions or concerns please call the internal medicine clinic at [phone].     Missy Sandhoff, MD  [facility]

## 2024-06-28 NOTE — Progress Notes (Signed)
 CC: New patient here to establish care.  HPI:  [name] is a 58 y.o. male living with a history stated below and presents today to establish care.  Previously followed with internal medicine in 2013.  Medical history Hypertension Hyperlipidemia Restless leg syndrome Degenerative disk disease  Past family history Colon cancer- Mom Lung cancer- dad  Surgical history Tonsillectomy  Medication allergies None  Social history Smokes 1 pack/day (?40 pack-year history). Occasional alcohol use. Denies illicit drug use. Married, lives with wife. Employed at a [employer].  Please see problem based assessment and plan for additional details.  Past Medical History:  Diagnosis Date   Amblyopia    right eye- surgery done during childhood   Dyslipidemia    Erectile dysfunction    Hypertension    Sleep apnoea, obstructed    mild    Current Outpatient Medications on File Prior to Visit  Medication Sig Dispense Refill   acetaminophen  (TYLENOL ) 500 MG tablet Take 1,500 mg by mouth every 6 (six) hours as needed for moderate pain.     meloxicam  (MOBIC ) 15 MG tablet Take 1 tablet (15 mg total) by mouth daily. 15 tablet 1   [DISCONTINUED] benazepril  (LOTENSIN ) 10 MG tablet Take 1 tablet (10 mg total) by mouth daily. (Patient not taking: No sig reported) 90 tablet 3   [DISCONTINUED] PARoxetine  (PAXIL ) 20 MG tablet Take 1 tablet (20 mg total) by mouth daily. (Patient not taking: No sig reported) 90 tablet 3   [DISCONTINUED] rosuvastatin  (CRESTOR ) 10 MG tablet Take 1 tablet (10 mg total) by mouth at bedtime. (Patient not taking: Reported on 09/02/2015) 30 tablet 11   No current facility-administered medications on file prior to visit.    Review of Systems: ROS negative except for what is noted on the assessment and plan.  Vitals:   06/28/24 1529 06/28/24 1540  BP: (!) 203/99 (!) 197/98  Pulse: 85 81  SpO2: 94%   Weight: 228 lb 12.8 oz (103.8  kg)   Height: 5' 9 (1.753 m)       Physical Exam: Constitutional: NAD Cardiovascular: RRR, no murmurs. Pulmonary/Chest: Clear bilateral lungs Abdominal: soft, non-tender, small umbilical hernia that is reducible.  Normal bowel sounds.  Assessment & Plan:   Patient discussed with Dr. Lovie  Assessment & Plan Essential hypertension Uncontrolled hypertension, BP elevated on both initial and repeat checks; patient asymptomatic and not on antihypertensives. - Start losartan-HCTZ 50-12.5 mg combination pill  due to significant elevation. - will check A1c and lipid panel to screen for diabetes and hyperlipidemia; obtain baseline for potential statin therapy. - Encourage home BP monitoring. - Follow-up in 2 weeks for BP and lab review. Restless leg syndrome Chronic symptoms >5 years, not on medication. No clear aggravating factors identified. Plan: - Will check serum iron, ferritin, TIBC. - If ferritin is low, replete with oral or IV iron as appropriate. - Consider gabapentin initiation if ferritin is normal. History of cigarette smoking 40-pack-year smoking history. Previously tried nicotine patches, gum, and Chantix  with unclear benefit. - Consider restarting Chantix  at next office visit for smoking cessation support.  Health maintenance screening Next OV will consider low-dose CT scan for lung cancer screening  And Colon cancer screening Orders Placed This Encounter  Procedures   Comprehensive metabolic panel with GFR   CBC no Diff   Lipid Profile   Hemoglobin A1c   Ferritin   Iron and IBC ([id number])   Isaiah Sandhoff, MD [facility] Internal Medicine, PGY-2  Date 06/28/2024 Time 4:33 PM

## 2024-06-29 ENCOUNTER — Ambulatory Visit: Payer: Self-pay | Admitting: Student

## 2024-06-29 ENCOUNTER — Other Ambulatory Visit: Payer: Self-pay

## 2024-06-29 LAB — LIPID PANEL
Chol/HDL Ratio: 7 ratio — ABNORMAL HIGH (ref 0.0–5.0)
Cholesterol, Total: 251 mg/dL — ABNORMAL HIGH (ref 100–199)
HDL: 36 mg/dL — ABNORMAL LOW (ref >39)
LDL Chol Calc (NIH): 178 mg/dL — ABNORMAL HIGH (ref 0–99)
Triglycerides: 196 mg/dL — ABNORMAL HIGH (ref 0–149)
VLDL Cholesterol Cal: 37 mg/dL (ref 5–40)

## 2024-06-29 LAB — COMPREHENSIVE METABOLIC PANEL WITH GFR
ALT: 24 IU/L (ref 0–44)
AST: 21 IU/L (ref 0–40)
Albumin: 4.4 g/dL (ref 3.8–4.9)
Alkaline Phosphatase: 111 IU/L (ref 47–123)
BUN/Creatinine Ratio: 12 (ref 9–20)
BUN: 11 mg/dL (ref 6–24)
Bilirubin Total: 0.2 mg/dL (ref 0.0–1.2)
CO2: 23 mmol/L (ref 20–29)
Calcium: 9.2 mg/dL (ref 8.7–10.2)
Chloride: 103 mmol/L (ref 96–106)
Creatinine, Ser: 0.89 mg/dL (ref 0.76–1.27)
Globulin, Total: 2.6 g/dL (ref 1.5–4.5)
Glucose: 85 mg/dL (ref 70–99)
Potassium: 3.9 mmol/L (ref 3.5–5.2)
Sodium: 141 mmol/L (ref 134–144)
Total Protein: 7 g/dL (ref 6.0–8.5)
eGFR: 100 mL/min/1.73 (ref >59)

## 2024-06-29 LAB — IRON AND TIBC
Iron Saturation: 13 % — ABNORMAL LOW (ref 15–55)
Iron: 39 ug/dL (ref 38–169)
Total Iron Binding Capacity: 290 ug/dL (ref 250–450)
UIBC: 251 ug/dL (ref 111–343)

## 2024-06-29 LAB — HEMOGLOBIN A1C
Est. average glucose Bld gHb Est-mCnc: 131 mg/dL
Hgb A1c MFr Bld: 6.2 % — ABNORMAL HIGH (ref 4.8–5.6)

## 2024-06-29 LAB — CBC
Hematocrit: 48 % (ref 37.5–51.0)
Hemoglobin: 15.7 g/dL (ref 13.0–17.7)
MCH: 30.3 pg (ref 26.6–33.0)
MCHC: 32.7 g/dL (ref 31.5–35.7)
MCV: 93 fL (ref 79–97)
Platelets: 201 x10E3/uL (ref 150–450)
RBC: 5.19 x10E6/uL (ref 4.14–5.80)
RDW: 12.7 % (ref 11.6–15.4)
WBC: 10.1 x10E3/uL (ref 3.4–10.8)

## 2024-06-29 LAB — FERRITIN: Ferritin: 333 ng/mL (ref 30–400)

## 2024-06-29 MED ORDER — ATORVASTATIN CALCIUM 40 MG PO TABS
40.0000 mg | ORAL_TABLET | Freq: Every day | ORAL | 11 refills | Status: DC
  Filled 2024-06-29: qty 30, 30d supply, fill #0
  Filled 2024-07-22: qty 30, 30d supply, fill #1
  Filled 2024-08-23: qty 30, 30d supply, fill #2

## 2024-06-29 NOTE — Addendum Note (Signed)
 Addended by: CELESTINA CZAR on: 06/29/2024 02:17 PM   Modules accepted: Orders

## 2024-06-29 NOTE — Progress Notes (Signed)
 Internal Medicine Clinic Attending  Case discussed with the resident at the time of the visit.  We reviewed the resident's history and exam and pertinent patient test results.  I agree with the assessment, diagnosis, and plan of care documented in the resident's note.   Patient is here to establish care today.   For his hypertension, I agree with starting a combination agent with Losartan 50mg  / hydrochlorothiazide 12.5mg  daily  He is behind on his care gaps and interested in smoking cessation, so we'll also arrange close follow up with Dr Celestina

## 2024-06-29 NOTE — Progress Notes (Signed)
 CBC normal, normal LFTs on CMP.

## 2024-07-02 ENCOUNTER — Other Ambulatory Visit: Payer: Self-pay | Admitting: Student

## 2024-07-02 ENCOUNTER — Telehealth: Payer: Self-pay | Admitting: *Deleted

## 2024-07-02 ENCOUNTER — Other Ambulatory Visit: Payer: Self-pay

## 2024-07-02 MED ORDER — GABAPENTIN 100 MG PO CAPS
100.0000 mg | ORAL_CAPSULE | Freq: Every day | ORAL | 3 refills | Status: DC
  Filled 2024-07-02: qty 30, 30d supply, fill #0
  Filled 2024-07-20 – 2024-07-23 (×2): qty 30, 30d supply, fill #1

## 2024-07-02 NOTE — Telephone Encounter (Signed)
 Pt's wife here for visit with MD  States MD mentioned rx for gabapentin Wife/husband requesting rx be sent to [facility] at [facility]

## 2024-07-12 ENCOUNTER — Other Ambulatory Visit: Payer: Self-pay

## 2024-07-12 ENCOUNTER — Ambulatory Visit (INDEPENDENT_AMBULATORY_CARE_PROVIDER_SITE_OTHER): Payer: Self-pay | Admitting: Student

## 2024-07-12 VITALS — BP 167/87 | HR 73 | Temp 97.9°F | Ht 69.0 in | Wt 222.6 lb

## 2024-07-12 DIAGNOSIS — F17210 Nicotine dependence, cigarettes, uncomplicated: Secondary | ICD-10-CM | POA: Diagnosis not present

## 2024-07-12 DIAGNOSIS — Z1211 Encounter for screening for malignant neoplasm of colon: Secondary | ICD-10-CM

## 2024-07-12 DIAGNOSIS — Z79899 Other long term (current) drug therapy: Secondary | ICD-10-CM

## 2024-07-12 DIAGNOSIS — Z87891 Personal history of nicotine dependence: Secondary | ICD-10-CM | POA: Diagnosis not present

## 2024-07-12 DIAGNOSIS — I10 Essential (primary) hypertension: Secondary | ICD-10-CM

## 2024-07-12 DIAGNOSIS — Z114 Encounter for screening for human immunodeficiency virus [HIV]: Secondary | ICD-10-CM | POA: Insufficient documentation

## 2024-07-12 DIAGNOSIS — Z8249 Family history of ischemic heart disease and other diseases of the circulatory system: Secondary | ICD-10-CM | POA: Diagnosis not present

## 2024-07-12 DIAGNOSIS — Z122 Encounter for screening for malignant neoplasm of respiratory organs: Secondary | ICD-10-CM

## 2024-07-12 DIAGNOSIS — Z1159 Encounter for screening for other viral diseases: Secondary | ICD-10-CM

## 2024-07-12 MED ORDER — LOSARTAN POTASSIUM 50 MG PO TABS
50.0000 mg | ORAL_TABLET | Freq: Every day | ORAL | 3 refills | Status: DC
  Filled 2024-07-12: qty 90, 90d supply, fill #0
  Filled 2024-07-13 – 2024-07-20 (×2): qty 30, 30d supply, fill #0
  Filled 2024-08-09: qty 90, 90d supply, fill #0
  Filled 2024-09-04: qty 30, 30d supply, fill #1

## 2024-07-12 MED ORDER — CHLORTHALIDONE 25 MG PO TABS
25.0000 mg | ORAL_TABLET | Freq: Every day | ORAL | 3 refills | Status: AC
  Filled 2024-07-12 – 2024-08-09 (×4): qty 30, 30d supply, fill #0
  Filled 2024-09-04: qty 30, 30d supply, fill #1
  Filled 2024-10-05: qty 30, 30d supply, fill #2

## 2024-07-12 MED ORDER — VARENICLINE TARTRATE 0.5 MG PO TABS
ORAL_TABLET | ORAL | 0 refills | Status: AC
  Filled 2024-07-12: qty 95, 28d supply, fill #0

## 2024-07-12 MED ORDER — CHLORTHALIDONE 25 MG PO TABS: 25.0000 mg | ORAL_TABLET | Freq: Every day | ORAL | 3 refills | Status: DC

## 2024-07-12 MED ORDER — LOSARTAN POTASSIUM 50 MG PO TABS: 50.0000 mg | ORAL_TABLET | Freq: Every day | ORAL | 3 refills | Status: DC

## 2024-07-12 NOTE — Assessment & Plan Note (Addendum)
 He meets criteria for annual lung cancer screening with low-dose CT. Order placed.

## 2024-07-12 NOTE — Assessment & Plan Note (Addendum)
 Current smoker with >30 pack-year history, motivated to quit. No contraindications to varenicline  initiation (denies PTSD, vivid dreams, or other psychiatric contraindications), will treat for 12 weeks  -Start varenicline  1-2 weeks before planned quit date. - Dosing: 0.5 mg once daily on days 1-3, then 0.5 mg twice daily on days 4-7, followed by 1 mg twice daily from day 8 onward.

## 2024-07-12 NOTE — Assessment & Plan Note (Signed)
 HIV antibodies with reflex ordered.

## 2024-07-12 NOTE — Assessment & Plan Note (Addendum)
 BP Readings from Last 3 Encounters:  07/12/24 (!) 167/87  06/28/24 (!) 197/98  08/31/20 (!) 164/99  Blood pressure has improved with initiation of antihypertensive therapy but remains above goal. Current regimen: losartan-HCTZ 50/12.5 mg. As higher-dose combination is unavailable, will discontinue the combination pill and transition to losartan 50 mg daily plus chlorthalidone 25 mg daily. - Start losartan 50 mg daily, and chlorthalidone 25 mg. - Check BMP today. - Follow up in 4 weeks for blood pressure reassessment.

## 2024-07-12 NOTE — Progress Notes (Signed)
 CC: 2-week follow-up on blood pressure  HPI:  [name] is a 58 y.o. male with a history of uncontrolled hypertension, who presents for follow-up.  No acute concerns, he has been checking his blood pressures at home SBP averaging 160-180.   He endorses mild muscle pain with atorvastatin but bearable.   Please see problem based assessment and plan for additional details.  Past Medical History:  Diagnosis Date   Amblyopia    right eye- surgery done during childhood   Dyslipidemia    Erectile dysfunction    Hypertension    Sleep apnoea, obstructed    mild    Current Outpatient Medications on File Prior to Visit  Medication Sig Dispense Refill   acetaminophen  (TYLENOL ) 500 MG tablet Take 1,500 mg by mouth every 6 (six) hours as needed for moderate pain.     atorvastatin (LIPITOR) 40 MG tablet Take 1 tablet (40 mg total) by mouth daily. 30 tablet 11   gabapentin (NEURONTIN) 100 MG capsule Take 1 capsule (100 mg total) by mouth at bedtime. 30 capsule 3   [DISCONTINUED] benazepril  (LOTENSIN ) 10 MG tablet Take 1 tablet (10 mg total) by mouth daily. (Patient not taking: No sig reported) 90 tablet 3   [DISCONTINUED] PARoxetine  (PAXIL ) 20 MG tablet Take 1 tablet (20 mg total) by mouth daily. (Patient not taking: No sig reported) 90 tablet 3   [DISCONTINUED] rosuvastatin  (CRESTOR ) 10 MG tablet Take 1 tablet (10 mg total) by mouth at bedtime. (Patient not taking: Reported on 09/02/2015) 30 tablet 11   No current facility-administered medications on file prior to visit.    Review of Systems: ROS negative except for what is noted on the assessment and plan.  Vitals:   07/12/24 1549 07/12/24 1624  BP: (!) 168/87 (!) 167/87  Pulse: 80 73  Temp: 97.9 F (36.6 C)   TempSrc: Oral   SpO2: 100%   Weight: 222 lb 9.6 oz (101 kg)   Height: 5' 9 (1.753 m)     Physical Exam: Constitutional: NAD Cardiovascular: RRR, no murmurs. Pulmonary/Chest: Clear bilateral lungs Abdominal:  soft, non-tender, non-distended.  Assessment & Plan:   Patient discussed with Dr. Karna  Assessment & Plan Essential hypertension BP Readings from Last 3 Encounters:  07/12/24 (!) 167/87  06/28/24 (!) 197/98  08/31/20 (!) 164/99  Blood pressure has improved with initiation of antihypertensive therapy but remains above goal. Current regimen: losartan-HCTZ 50/12.5 mg. As higher-dose combination is unavailable, will discontinue the combination pill and transition to losartan 50 mg daily plus chlorthalidone 25 mg daily. - Start losartan 50 mg daily, and chlorthalidone 25 mg. - Check BMP today. - Follow up in 4 weeks for blood pressure reassessment. History of cigarette smoking Current smoker with >30 pack-year history, motivated to quit. No contraindications to varenicline  initiation (denies PTSD, vivid dreams, or other psychiatric contraindications), will treat for 12 weeks  -Start varenicline  1-2 weeks before planned quit date. - Dosing: 0.5 mg once daily on days 1-3, then 0.5 mg twice daily on days 4-7, followed by 1 mg twice daily from day 8 onward. Screening for lung cancer He meets criteria for annual lung cancer screening with low-dose CT. Order placed. Colon cancer screening Referred for colonoscopy. Encounter for screening for HIV HIV antibodies with reflex ordered. Need for hepatitis C screening test Screening, HCV antibodies.  Orders Placed This Encounter  Procedures   CT CHEST LUNG CA SCREEN LOW DOSE W/O CM   BMP8   Hepatitis C Ab reflex to  Quant PCR   HIV antibody (with reflex)   Ambulatory referral to Gastroenterology    Missy Sandhoff, MD [facility] Internal Medicine, PGY-2  Date 07/12/2024 Time 4:51 PM

## 2024-07-12 NOTE — Patient Instructions (Addendum)
 It was a pleasure taking care of you today!    1.  Your blood pressure is improved but still elevated.  I will start you on chlorthalidone 25 mg and losartan 50 mg.  2.  I have sent a referral for colonoscopy, someone from the office will call to get this scheduled.  3.  I will get a low-dose CT of your lungs for lung cancer screening.  4.  For the smoking cessation, I have sent  Chantix  to the pharmacy. Dosing as below:   Days 1-3: 0.5 mg once daily  Days 4-7: 0.5 mg twice daily  Day 8 onward: 1 mg twice daily (as tolerated)  I have ordered the following labs for you:  Lab Orders         BMP8         Hepatitis C Ab reflex to Quant PCR         HIV antibody (with reflex)        Follow up: 4 weeks for blood pressure    Should you have any questions or concerns please call the internal medicine clinic at [phone].     Missy Sandhoff, MD  [facility]

## 2024-07-12 NOTE — Assessment & Plan Note (Signed)
 Referred for colonoscopy

## 2024-07-13 ENCOUNTER — Other Ambulatory Visit: Payer: Self-pay

## 2024-07-13 ENCOUNTER — Ambulatory Visit: Payer: Self-pay | Admitting: Student

## 2024-07-13 LAB — BMP8
BUN: 13 mg/dL (ref 6–24)
CO2: 29 mmol/L (ref 20–29)
Calcium: 9.7 mg/dL (ref 8.7–10.2)
Chloride: 99 mmol/L (ref 96–106)
Creatinine, Ser: 0.84 mg/dL (ref 0.76–1.27)
Glucose: 91 mg/dL (ref 70–99)
Potassium: 3.9 mmol/L (ref 3.5–5.2)
Sodium: 139 mmol/L (ref 134–144)
eGFR: 102 mL/min/1.73 (ref >59)

## 2024-07-13 LAB — HIV ANTIBODY (ROUTINE TESTING W REFLEX): HIV Screen 4th Generation wRfx: NONREACTIVE

## 2024-07-13 LAB — HCV AB W REFLEX TO QUANT PCR: HCV Ab: NONREACTIVE

## 2024-07-13 LAB — HCV INTERPRETATION

## 2024-07-14 NOTE — Progress Notes (Signed)
 Internal Medicine Clinic Attending  Case discussed with the resident at the time of the visit.  We reviewed the resident's history and exam and pertinent patient test results.  I agree with the assessment, diagnosis, and plan of care documented in the resident's note.

## 2024-07-21 ENCOUNTER — Other Ambulatory Visit: Payer: Self-pay

## 2024-07-22 ENCOUNTER — Other Ambulatory Visit: Payer: Self-pay

## 2024-07-23 ENCOUNTER — Other Ambulatory Visit: Payer: Self-pay

## 2024-07-23 ENCOUNTER — Other Ambulatory Visit: Payer: Self-pay | Admitting: Student

## 2024-07-26 ENCOUNTER — Other Ambulatory Visit: Payer: Self-pay

## 2024-07-26 MED ORDER — GABAPENTIN 100 MG PO CAPS
100.0000 mg | ORAL_CAPSULE | Freq: Every day | ORAL | 3 refills | Status: DC
  Filled 2024-07-26: qty 30, 30d supply, fill #0
  Filled 2024-08-09 – 2024-08-10 (×2): qty 30, 30d supply, fill #1
  Filled ????-??-??: fill #1

## 2024-08-10 ENCOUNTER — Other Ambulatory Visit: Payer: Self-pay

## 2024-08-12 ENCOUNTER — Ambulatory Visit

## 2024-08-12 ENCOUNTER — Other Ambulatory Visit: Payer: Self-pay

## 2024-08-12 VITALS — BP 127/77 | HR 81 | Temp 97.9°F | Ht 69.0 in | Wt 231.6 lb

## 2024-08-12 DIAGNOSIS — Z87891 Personal history of nicotine dependence: Secondary | ICD-10-CM

## 2024-08-12 DIAGNOSIS — E7849 Other hyperlipidemia: Secondary | ICD-10-CM

## 2024-08-12 DIAGNOSIS — I10 Essential (primary) hypertension: Secondary | ICD-10-CM

## 2024-08-12 DIAGNOSIS — F17210 Nicotine dependence, cigarettes, uncomplicated: Secondary | ICD-10-CM | POA: Diagnosis not present

## 2024-08-12 DIAGNOSIS — G2581 Restless legs syndrome: Secondary | ICD-10-CM | POA: Diagnosis not present

## 2024-08-12 MED ORDER — VARENICLINE TARTRATE 1 MG PO TABS
1.0000 mg | ORAL_TABLET | Freq: Two times a day (BID) | ORAL | 2 refills | Status: AC
  Filled 2024-08-12: qty 60, 30d supply, fill #0
  Filled 2024-09-07: qty 60, 30d supply, fill #1

## 2024-08-12 MED ORDER — GABAPENTIN 300 MG PO CAPS
300.0000 mg | ORAL_CAPSULE | Freq: Every day | ORAL | 3 refills | Status: AC
  Filled 2024-08-12: qty 30, 30d supply, fill #0
  Filled 2024-09-07: qty 30, 30d supply, fill #1

## 2024-08-12 NOTE — Assessment & Plan Note (Signed)
 Patient reports RLS has not improved with current dose of gabapentin .  [place] he was started on gabapentin  100 mg which was increased to 200 mg on discussion with Dr. Celestina over the phone.  Patient reports has been taking 200 mg for the past 2 weeks, however, has not seen any improvement in RLS.  Did not see any change in gabapentin  from 100 mg to 200 mg in patient's chart.  Recent GFR was normal; will increase gabapentin  to 300 mg taken at bedtime.  - Gabapentin  increased to 300 mg daily

## 2024-08-12 NOTE — Progress Notes (Signed)
 CC: 1 month BP follow-up  HPI:  [name] is a 58 y.o. male living with a history stated below and presents today for 1 month BP follow-up. Please see problem based assessment and plan for additional details.  Past Medical History:  Diagnosis Date   Amblyopia    right eye- surgery done during childhood   Dyslipidemia    Erectile dysfunction    Hypertension    Sleep apnoea, obstructed    mild    Current Outpatient Medications on File Prior to Visit  Medication Sig Dispense Refill   acetaminophen  (TYLENOL ) 500 MG tablet Take 1,500 mg by mouth every 6 (six) hours as needed for moderate pain.     atorvastatin  (LIPITOR) 40 MG tablet Take 1 tablet (40 mg total) by mouth daily. 30 tablet 11   chlorthalidone  (HYGROTON ) 25 MG tablet Take 1 tablet (25 mg total) by mouth daily. 90 tablet 3   gabapentin  (NEURONTIN ) 100 MG capsule Take 1 capsule (100 mg total) by mouth at bedtime. 30 capsule 3   losartan  (COZAAR ) 50 MG tablet Take 1 tablet (50 mg total) by mouth daily. 90 tablet 3   [DISCONTINUED] benazepril  (LOTENSIN ) 10 MG tablet Take 1 tablet (10 mg total) by mouth daily. (Patient not taking: No sig reported) 90 tablet 3   [DISCONTINUED] PARoxetine  (PAXIL ) 20 MG tablet Take 1 tablet (20 mg total) by mouth daily. (Patient not taking: No sig reported) 90 tablet 3   [DISCONTINUED] rosuvastatin  (CRESTOR ) 10 MG tablet Take 1 tablet (10 mg total) by mouth at bedtime. (Patient not taking: Reported on 09/02/2015) 30 tablet 11   No current facility-administered medications on file prior to visit.    Family History  Problem Relation Age of Onset   Diabetes Mother    Hypertension Mother    Cancer Mother        colon cancer dx 3 years ago   Coronary artery disease Father    Hypertension Father    Valvular heart disease Father        h/o tricuspid valve replacement surgery   Coronary artery disease Sister        age of onset <60 in 54st degree male relatives    Social History    Socioeconomic History   Marital status: Married    Spouse name: Not on file   Number of children: Not on file   Years of education: Not on file   Highest education level: Not on file  Occupational History   Not on file  Tobacco Use   Smoking status: Every Day    Current packs/day: 1.00    Average packs/day: 1 pack/day for 20.0 years (20.0 ttl pk-yrs)    Types: Cigarettes   Smokeless tobacco: Current   Tobacco comments:    cut down from 1PAD to 5 cig daily since 11/12.Wants to talk about Chantix  - back up to 1 ppd  Substance and Sexual Activity   Alcohol use: Yes    Comment: occasional beer   Drug use: No   Sexual activity: Yes  Other Topics Concern   Not on file  Social History Narrative   Works in a geophysical data processor, solvents)   Social Drivers of Corporate Investment Banker Strain: Not on file  Food Insecurity: Not on file  Transportation Needs: Not on file  Physical Activity: Not on file  Stress: Not on file  Social Connections: Not on file  Intimate Partner Violence: Not At Risk (06/28/2024)   Humiliation, Afraid,  Rape, and Kick questionnaire    Fear of Current or Ex-Partner: No    Emotionally Abused: No    Physically Abused: No    Sexually Abused: No    Review of Systems: ROS  Per HPI, assessment, plan There were no vitals filed for this visit.  Physical Exam: Physical Exam HENT:     Head: Normocephalic.  Cardiovascular:     Rate and Rhythm: Normal rate and regular rhythm.     Comments: + RP BL Pulmonary:     Effort: Pulmonary effort is normal.     Breath sounds: Normal breath sounds.  Skin:    General: Skin is warm.  Neurological:     Mental Status: He is alert.      Assessment & Plan:     Patient seen with Dr. CHARLENA Eastern  Assessment & Plan Other hyperlipidemia Started on atorvastatin  after lipid panel from 10/20 showed elevated LDL 178.  Will recheck lipid panel today.  Patient reports taking atorvastatin  40 mg  daily. -Continue atorvastatin  40 mg daily -Lipid panel today Essential hypertension Patient's blood pressure today was 147/77 with a repeat of 127/77.  Reports taking losartan  50 mg daily and chlorthalidone  25 mg daily. BMP a month ago was WNL.  Patient to continue with the medication regimen as BP appears to be controlled.  Discussed with patient to check his BP daily and record it. - Continue losartan  50 mg and chlorthalidone  25 mg daily - Check BP daily and record it History of cigarette smoking Patient mentions that his smoking cravings have gone down after he started Chantix  last month.  Wants to continue taking it.  Reports mild nausea while taking the medication, however, it resolves when he eats something.  Will continue with Chantix  1 mg twice daily.  Will follow-up with patient in 1 month for smoking cessation.  - Chantix  1 mg twice daily for 8 more weeks - Follow-up in 1 month  Restless leg syndrome Patient reports RLS has not improved with current dose of gabapentin .  Said he was started on gabapentin  100 mg which was increased to 200 mg on discussion with Dr. Celestina over the phone.  Patient reports has been taking 200 mg for the past 2 weeks, however, has not seen any improvement in RLS.  Did not see any change in gabapentin  from 100 mg to 200 mg in patient's chart.  Recent GFR was normal; will increase gabapentin  to 300 mg taken at bedtime.  - Gabapentin  increased to 300 mg daily   No orders of the defined types were placed in this encounter.    [name], D.O. [facility] Internal Medicine, PGY-1 Date 08/12/2024 Time 3:34 PM

## 2024-08-12 NOTE — Patient Instructions (Addendum)
 Please follow instructions as discussed in today's plan: -Start taking gabapentin  300 mg: 1 tablet daily during bedtime for restless leg syndrome -Continue taking your losartan  50 mg daily and chlorthalidone  25 mg daily -Continue taking Chantix  1 mg tablet 2 times daily -Keep checking your blood pressure every day and document it  -Continue taking all your other medications as prescribed - Bring all your medications for every visit -Follow-up with us  in 1 month  Thank You  [name]

## 2024-08-12 NOTE — Assessment & Plan Note (Signed)
 Patient mentions that his smoking cravings have gone down after he started Chantix  last month.  Wants to continue taking it.  Reports mild nausea while taking the medication, however, it resolves when he eats something.  Will continue with Chantix  1 mg twice daily.  Will follow-up with patient in 1 month for smoking cessation.  - Chantix  1 mg twice daily for 8 more weeks - Follow-up in 1 month

## 2024-08-12 NOTE — Assessment & Plan Note (Signed)
 Patient's blood pressure today was 147/77 with a repeat of 127/77.  Reports taking losartan  50 mg daily and chlorthalidone  25 mg daily. BMP a month ago was WNL.  Patient to continue with the medication regimen as BP appears to be controlled.  Discussed with patient to check his BP daily and record it. - Continue losartan  50 mg and chlorthalidone  25 mg daily - Check BP daily and record it

## 2024-08-13 LAB — LIPID PANEL
Chol/HDL Ratio: 5.4 ratio — ABNORMAL HIGH (ref 0.0–5.0)
Cholesterol, Total: 182 mg/dL (ref 100–199)
HDL: 34 mg/dL — ABNORMAL LOW (ref >39)
LDL Chol Calc (NIH): 97 mg/dL (ref 0–99)
Triglycerides: 300 mg/dL — ABNORMAL HIGH (ref 0–149)
VLDL Cholesterol Cal: 51 mg/dL — ABNORMAL HIGH (ref 5–40)

## 2024-08-18 ENCOUNTER — Ambulatory Visit: Payer: Self-pay

## 2024-08-18 NOTE — Telephone Encounter (Signed)
 Call unanswered x2. Left VM to call back and informed results available on mychart.

## 2024-08-20 NOTE — Progress Notes (Signed)
 Internal Medicine Clinic Attending  I was physically present during the key portions of the resident provided service and participated in the medical decision making of patient's management care. I reviewed pertinent patient test results.  The assessment, diagnosis, and plan were formulated together and I agree with the documentation in the resident's note.  Rosan Dayton BROCKS, DO

## 2024-09-07 ENCOUNTER — Other Ambulatory Visit: Payer: Self-pay

## 2024-09-07 ENCOUNTER — Encounter: Payer: Self-pay | Admitting: Pharmacist

## 2024-09-08 ENCOUNTER — Other Ambulatory Visit: Payer: Self-pay

## 2024-09-08 ENCOUNTER — Other Ambulatory Visit (HOSPITAL_COMMUNITY): Payer: Self-pay

## 2024-09-08 ENCOUNTER — Encounter: Payer: Self-pay | Admitting: Pharmacist

## 2024-09-16 ENCOUNTER — Ambulatory Visit: Payer: Self-pay

## 2024-09-16 VITALS — BP 139/72 | HR 80 | Temp 98.1°F | Ht 69.0 in | Wt 227.8 lb

## 2024-09-16 DIAGNOSIS — E782 Mixed hyperlipidemia: Secondary | ICD-10-CM

## 2024-09-16 DIAGNOSIS — I10 Essential (primary) hypertension: Secondary | ICD-10-CM

## 2024-09-16 DIAGNOSIS — Z23 Encounter for immunization: Secondary | ICD-10-CM | POA: Diagnosis not present

## 2024-09-16 DIAGNOSIS — F17200 Nicotine dependence, unspecified, uncomplicated: Secondary | ICD-10-CM

## 2024-09-16 DIAGNOSIS — Z0000 Encounter for general adult medical examination without abnormal findings: Secondary | ICD-10-CM | POA: Insufficient documentation

## 2024-09-16 DIAGNOSIS — E785 Hyperlipidemia, unspecified: Secondary | ICD-10-CM

## 2024-09-16 DIAGNOSIS — F17201 Nicotine dependence, unspecified, in remission: Secondary | ICD-10-CM | POA: Diagnosis not present

## 2024-09-16 DIAGNOSIS — Z87891 Personal history of nicotine dependence: Secondary | ICD-10-CM

## 2024-09-16 MED ORDER — LOSARTAN POTASSIUM 100 MG PO TABS: 100.0000 mg | ORAL_TABLET | Freq: Every day | ORAL | 11 refills | Status: AC

## 2024-09-16 MED ORDER — ATORVASTATIN CALCIUM 80 MG PO TABS: 80.0000 mg | ORAL_TABLET | Freq: Every day | ORAL | 11 refills | Status: AC

## 2024-09-16 NOTE — Progress Notes (Signed)
 "  CC: Follow-up for hypertension, dyslipidemia, and smoking cessation.  HPI:  [name] is a 59 y.o. male living with a history of hypertension, dyslipidemia, mild obstructive sleep apnea, restless leg syndrome, and tobacco use who presents today for routine follow-up. He was re-established in care in October 2025 with significantly elevated blood pressures and has since been treated with antihypertensive therapy, resulting in substantial improvement. He reports adherence to losartan  and chlorthalidone  without side effects and checks his blood pressure intermittently at home, noting systolic readings generally in the 130s to 140s. He denies headaches, dizziness, chest pain, dyspnea, or visual changes.  He was started on atorvastatin  for elevated LDL with high ASCVD risk and reports good tolerance without myalgias. Recent labs showed significant LDL improvement, though triglycerides remain elevated. He recently started varenicline  and reports quitting smoking since [employer] Years Eve, with no cigarettes since that time. He is motivated to continue abstinence and denies mood changes or adverse effects from Chantix .  He denies acute complaints today. Please see problem-based assessment and plan for additional details.. Please see problem based assessment and plan for additional details.  Past Medical History:  Diagnosis Date   Amblyopia    right eye- surgery done during childhood   Dyslipidemia    Erectile dysfunction    Hypertension    Sleep apnoea, obstructed    mild    Medications Ordered Prior to Encounter[1]  Family History  Problem Relation Age of Onset   Diabetes Mother    Hypertension Mother    Cancer Mother        colon cancer dx 3 years ago   Coronary artery disease Father    Hypertension Father    Valvular heart disease Father        h/o tricuspid valve replacement surgery   Coronary artery disease Sister        age of onset <60 in 50st degree male relatives     Social History   Socioeconomic History   Marital status: Married    Spouse name: Not on file   Number of children: Not on file   Years of education: Not on file   Highest education level: Not on file  Occupational History   Not on file  Tobacco Use   Smoking status: Every Day    Current packs/day: 1.00    Average packs/day: 1 pack/day for 20.0 years (20.0 ttl pk-yrs)    Types: Cigarettes   Smokeless tobacco: Current   Tobacco comments:    cut down from 1PAD to 5 cig daily since 11/12.Wants to talk about Chantix  - back up to 1 ppd  Substance and Sexual Activity   Alcohol use: Yes    Comment: occasional beer   Drug use: No   Sexual activity: Yes  Other Topics Concern   Not on file  Social History Narrative   Works in a geophysical data processor, solvents)   Social Drivers of Health   Tobacco Use: High Risk (08/12/2024)   Patient History    Smoking Tobacco Use: Every Day    Smokeless Tobacco Use: Current    Passive Exposure: Not on file  Financial Resource Strain: Not on file  Food Insecurity: Not on file  Transportation Needs: Not on file  Physical Activity: Not on file  Stress: Not on file  Social Connections: Not on file  Intimate Partner Violence: Not At Risk (06/28/2024)   Epic    Fear of Current or Ex-Partner: No    Emotionally Abused: No  Physically Abused: No    Sexually Abused: No  Depression (PHQ2-9): Low Risk (08/12/2024)   Depression (PHQ2-9)    PHQ-2 Score: 0  Alcohol Screen: Not on file  Housing: Low Risk (06/28/2024)   Epic    Unable to Pay for Housing in the Last Year: No    Number of Times Moved in the Last Year: 0    Homeless in the Last Year: No  Utilities: Not on file  Health Literacy: Not on file    Review of Systems: ROS negative except for what is noted on the assessment and plan.  Vitals:   09/16/24 1533  BP: 139/72  Pulse: 80  Temp: 98.1 F (36.7 C)  TempSrc: Oral  SpO2: 95%  Weight: 227 lb 12.8 oz (103.3 kg)   Height: 5' 9 (1.753 m)    Physical Exam  Physical Exam: Constitutional: Well-appearing middle-aged male sitting comfortably in the exam room, in no acute distress HENT: Normocephalic, atraumatic, mucous membranes moist Eyes: Conjunctiva non-erythematous Neck: Supple, no lymphadenopathy Cardiovascular: Regular rate and rhythm, no murmurs, rubs, or gallops Pulmonary: Normal work of breathing on room air, lungs clear to auscultation bilaterally Abdominal: Soft, non-tender, non-distended MSK: No peripheral edema, normal range of motion Neurological: Alert and oriented x3, 5/5 strength in bilateral upper and lower extremities, normal gait Skin: Warm and dry Psych: Appropriate mood and affect, cooperative  Assessment & Plan:   Hyperlipemia He has dyslipidemia with a previously markedly elevated LDL that has improved substantially on atorvastatin  40 mg, demonstrating good response and tolerance. His triglycerides remain elevated at approximately 300, though it is unclear whether this was a fasting sample. LDL is currently 97, improved from 170, and given his cardiovascular risk factors, there is still room to further intensify statin therapy. Plan: Increase atorvastatin  to 80 mg daily Counsel on dietary modifications and lifestyle changes Repeat fasting lipid panel in 2 weeks   TOBACCO ABUSE Mr. Radi has a long history of tobacco use and recently quit smoking with the help of varenicline . He reports continued abstinence since [place] Years Eve and is motivated to remain smoke-free. He is early in cessation but currently doing well without medication side effects.  Plan: Continue varenicline  Reinforce smoking cessation and relapse prevention  Healthcare maintenance Patient is due for several age-appropriate preventive services and screenings, which were reviewed today.  Plan: Labs ordered: BMP with GFR, lipid profile Colon cancer screening pending Vaccinations (pneumococcal, flu)  received  History of cigarette smoking Mr. Navarette has a long history of tobacco use and recently quit smoking with the help of varenicline . He reports continued abstinence since [place] Years Eve and is motivated to remain smoke-free. He is early in cessation but currently doing well without medication side effects.  Plan: Continue varenicline  Reinforce smoking cessation and relapse prevention  HYPERTENSION Mr. Omahoney has a history of essential hypertension that was previously poorly controlled but has improved significantly with current therapy. Todays blood pressure is 139/72, which is close to goal but still above the recommended target of less than 130/80 given his elevated ASCVD risk. He is asymptomatic, adherent to medications, and tolerating his regimen without side effects, indicating an opportunity to further optimize control.  Plan: Increase losartan  to 100 mg daily Continue chlorthalidone  25 mg daily Encourage home blood pressure monitoring with log Nurse blood pressure check in 2 weeks   Patient discussed with Dr. Jeanelle Armando Rossetti M.D [facility] Internal Medicine, PGY-1 Phone: [phone] Date 09/16/2024 Time 5:12 PM     [  1]  Current Outpatient Medications on File Prior to Visit  Medication Sig Dispense Refill   acetaminophen  (TYLENOL ) 500 MG tablet Take 1,500 mg by mouth every 6 (six) hours as needed for moderate pain.     chlorthalidone  (HYGROTON ) 25 MG tablet Take 1 tablet (25 mg total) by mouth daily. 90 tablet 3   gabapentin  (NEURONTIN ) 300 MG capsule Take 1 capsule (300 mg total) by mouth at bedtime. 30 capsule 3   varenicline  (CHANTIX ) 1 MG tablet Take 1 tablet (1 mg total) by mouth 2 (two) times daily. 60 tablet 2   [DISCONTINUED] benazepril  (LOTENSIN ) 10 MG tablet Take 1 tablet (10 mg total) by mouth daily. (Patient not taking: No sig reported) 90 tablet 3   [DISCONTINUED] PARoxetine  (PAXIL ) 20 MG tablet Take 1 tablet (20 mg total) by mouth daily. (Patient  not taking: No sig reported) 90 tablet 3   [DISCONTINUED] rosuvastatin  (CRESTOR ) 10 MG tablet Take 1 tablet (10 mg total) by mouth at bedtime. (Patient not taking: Reported on 09/02/2015) 30 tablet 11   No current facility-administered medications on file prior to visit.   "

## 2024-09-16 NOTE — Assessment & Plan Note (Signed)
 Isaiah Gomez has a long history of tobacco use and recently quit smoking with the help of varenicline . He reports continued abstinence since [place] Years Eve and is motivated to remain smoke-free. He is early in cessation but currently doing well without medication side effects.  Plan: Continue varenicline  Reinforce smoking cessation and relapse prevention

## 2024-09-16 NOTE — Assessment & Plan Note (Signed)
 Mr. Kitson has a long history of tobacco use and recently quit smoking with the help of varenicline . He reports continued abstinence since [place] Years Eve and is motivated to remain smoke-free. He is early in cessation but currently doing well without medication side effects.  Plan: Continue varenicline  Reinforce smoking cessation and relapse prevention

## 2024-09-16 NOTE — Assessment & Plan Note (Signed)
 Isaiah Gomez has a history of essential hypertension that was previously poorly controlled but has improved significantly with current therapy. Todays blood pressure is 139/72, which is close to goal but still above the recommended target of less than 130/80 given his elevated ASCVD risk. He is asymptomatic, adherent to medications, and tolerating his regimen without side effects, indicating an opportunity to further optimize control.  Plan: Increase losartan  to 100 mg daily Continue chlorthalidone  25 mg daily Encourage home blood pressure monitoring with log Nurse blood pressure check in 2 weeks

## 2024-09-16 NOTE — Patient Instructions (Addendum)
 Thank you, [name] for allowing us  to provide your care today. During your visit, we reviewed your blood pressure and cholesterol control and discussed the progress you have made, including your commitment to quitting smoking, which is an important step for your overall health. Your blood pressure remains slightly above goal, and your triglycerides are still elevated, so we made medication adjustments to further reduce your cardiovascular risk and better protect your heart.  Medication changes today: We increased your losartan  dose to 100 mg daily and your atorvastatin  dose to 80 mg daily.  Next steps and follow-up: Please check and log your blood pressure at home for the next two weeks and bring your readings with you for a nurse blood pressure check in two weeks. We have also ordered blood work to be completed in two weeks, including a basic metabolic panel and a lipid profile. Please schedule an appointment for the lab draw at that time. I have ordered the following labs for you:    Lab Orders         Basic metabolic panel with GFR         Lipid Profile      Referrals ordered today:   Referral Orders  No referral(s) requested today     I have ordered the following medication/changed the following medications:   Stop the following medications: Medications Discontinued During This Encounter  Medication Reason   atorvastatin  (LIPITOR) 40 MG tablet Dose change   losartan  (COZAAR ) 50 MG tablet Dose change     Start the following medications: Meds ordered this encounter  Medications   atorvastatin  (LIPITOR) 80 MG tablet    Sig: Take 1 tablet (80 mg total) by mouth daily.    Dispense:  30 tablet    Refill:  11   losartan  (COZAAR ) 100 MG tablet    Sig: Take 1 tablet (100 mg total) by mouth daily.    Dispense:  30 tablet    Refill:  11     Follow up: 2 weeks for blood test, 2 months office visit  Remember:   Should you have any questions or concerns please call the  internal medicine clinic at [phone].    Armando Rossetti, M.D [facility]

## 2024-09-16 NOTE — Assessment & Plan Note (Signed)
 Patient is due for several age-appropriate preventive services and screenings, which were reviewed today.  Plan: Labs ordered: BMP with GFR, lipid profile Colon cancer screening pending Vaccinations (pneumococcal, flu) received

## 2024-09-16 NOTE — Assessment & Plan Note (Signed)
 He has dyslipidemia with a previously markedly elevated LDL that has improved substantially on atorvastatin  40 mg, demonstrating good response and tolerance. His triglycerides remain elevated at approximately 300, though it is unclear whether this was a fasting sample. LDL is currently 97, improved from 170, and given his cardiovascular risk factors, there is still room to further intensify statin therapy. Plan: Increase atorvastatin  to 80 mg daily Counsel on dietary modifications and lifestyle changes Repeat fasting lipid panel in 2 weeks

## 2024-09-22 NOTE — Progress Notes (Signed)
 Internal Medicine Clinic Attending  Case discussed with the resident at the time of the visit.  We reviewed the resident's history and exam and pertinent patient test results.  I agree with the assessment, diagnosis, and plan of care documented in the resident's note.

## 2024-09-24 ENCOUNTER — Other Ambulatory Visit: Payer: Self-pay

## 2024-09-27 ENCOUNTER — Encounter: Payer: Self-pay | Admitting: Gastroenterology

## 2024-09-30 ENCOUNTER — Other Ambulatory Visit: Payer: Self-pay

## 2024-09-30 DIAGNOSIS — I10 Essential (primary) hypertension: Secondary | ICD-10-CM

## 2024-09-30 DIAGNOSIS — E782 Mixed hyperlipidemia: Secondary | ICD-10-CM | POA: Diagnosis not present

## 2024-10-01 LAB — BASIC METABOLIC PANEL WITH GFR
BUN/Creatinine Ratio: 18 (ref 9–20)
BUN: 16 mg/dL (ref 6–24)
CO2: 30 mmol/L — ABNORMAL HIGH (ref 20–29)
Calcium: 9.4 mg/dL (ref 8.7–10.2)
Chloride: 96 mmol/L (ref 96–106)
Creatinine, Ser: 0.9 mg/dL (ref 0.76–1.27)
Glucose: 121 mg/dL — ABNORMAL HIGH (ref 70–99)
Potassium: 3.7 mmol/L (ref 3.5–5.2)
Sodium: 142 mmol/L (ref 134–144)
eGFR: 99 mL/min/1.73

## 2024-10-01 LAB — LIPID PANEL
Chol/HDL Ratio: 3.7 ratio (ref 0.0–5.0)
Cholesterol, Total: 164 mg/dL (ref 100–199)
HDL: 44 mg/dL
LDL Chol Calc (NIH): 98 mg/dL (ref 0–99)
Triglycerides: 120 mg/dL (ref 0–149)
VLDL Cholesterol Cal: 22 mg/dL (ref 5–40)

## 2024-10-18 ENCOUNTER — Encounter

## 2024-11-01 ENCOUNTER — Encounter: Admitting: Gastroenterology
